# Patient Record
Sex: Female | Born: 1970
Health system: Southern US, Community
[De-identification: ages and names within clinical notes are randomized; demographics above are authoritative.]

## PROBLEM LIST (undated history)

## (undated) DIAGNOSIS — K219 Gastro-esophageal reflux disease without esophagitis: Secondary | ICD-10-CM

## (undated) DIAGNOSIS — D649 Anemia, unspecified: Secondary | ICD-10-CM

## (undated) DIAGNOSIS — T8859XA Other complications of anesthesia, initial encounter: Secondary | ICD-10-CM

## (undated) DIAGNOSIS — F329 Major depressive disorder, single episode, unspecified: Secondary | ICD-10-CM

## (undated) DIAGNOSIS — R112 Nausea with vomiting, unspecified: Secondary | ICD-10-CM

## (undated) DIAGNOSIS — M1990 Unspecified osteoarthritis, unspecified site: Secondary | ICD-10-CM

## (undated) DIAGNOSIS — Z9889 Other specified postprocedural states: Secondary | ICD-10-CM

## (undated) DIAGNOSIS — Z8489 Family history of other specified conditions: Secondary | ICD-10-CM

## (undated) DIAGNOSIS — Z87442 Personal history of urinary calculi: Secondary | ICD-10-CM

## (undated) DIAGNOSIS — I10 Essential (primary) hypertension: Secondary | ICD-10-CM

## (undated) DIAGNOSIS — O009 Unspecified ectopic pregnancy without intrauterine pregnancy: Secondary | ICD-10-CM

## (undated) DIAGNOSIS — B019 Varicella without complication: Secondary | ICD-10-CM

## (undated) HISTORY — DX: Unspecified ectopic pregnancy without intrauterine pregnancy: O00.90

## (undated) HISTORY — DX: Essential (primary) hypertension: I10

## (undated) HISTORY — DX: Varicella without complication: B01.9

## (undated) HISTORY — DX: Anemia, unspecified: D64.9

## (undated) HISTORY — DX: Major depressive disorder, single episode, unspecified: F32.9

## (undated) HISTORY — PX: TYMPANOSTOMY TUBE PLACEMENT: SHX32

## (undated) HISTORY — DX: Gastro-esophageal reflux disease without esophagitis: K21.9

## (undated) HISTORY — PX: FRACTURE SURGERY: SHX138

---
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
## 1993-01-13 HISTORY — PX: TUBAL LIGATION: SHX77

## 2002-01-13 HISTORY — PX: LITHOTRIPSY: SUR834

## 2003-03-05 ENCOUNTER — Other Ambulatory Visit: Payer: Self-pay

## 2004-01-14 HISTORY — PX: OTHER SURGICAL HISTORY: SHX169

## 2004-08-07 ENCOUNTER — Ambulatory Visit: Payer: Self-pay

## 2004-12-30 ENCOUNTER — Observation Stay: Payer: Self-pay | Admitting: Obstetrics & Gynecology

## 2005-01-28 ENCOUNTER — Observation Stay: Payer: Self-pay

## 2005-03-01 ENCOUNTER — Observation Stay: Payer: Self-pay

## 2005-04-07 ENCOUNTER — Inpatient Hospital Stay: Payer: Self-pay | Admitting: Obstetrics & Gynecology

## 2005-04-07 ENCOUNTER — Observation Stay: Payer: Self-pay | Admitting: Obstetrics & Gynecology

## 2005-04-09 DIAGNOSIS — O339 Maternal care for disproportion, unspecified: Secondary | ICD-10-CM

## 2005-04-09 DIAGNOSIS — O41 Oligohydramnios: Secondary | ICD-10-CM

## 2005-04-14 ENCOUNTER — Emergency Department: Payer: Self-pay | Admitting: Emergency Medicine

## 2005-04-14 ENCOUNTER — Other Ambulatory Visit: Payer: Self-pay

## 2005-05-13 ENCOUNTER — Encounter: Payer: Self-pay | Admitting: Family Medicine

## 2005-05-13 LAB — CONVERTED CEMR LAB: Pap Smear: NORMAL

## 2006-01-13 DIAGNOSIS — O009 Unspecified ectopic pregnancy without intrauterine pregnancy: Secondary | ICD-10-CM

## 2006-01-13 HISTORY — DX: Unspecified ectopic pregnancy without intrauterine pregnancy: O00.90

## 2006-02-04 ENCOUNTER — Ambulatory Visit: Payer: Self-pay | Admitting: Family Medicine

## 2006-06-11 ENCOUNTER — Ambulatory Visit: Payer: Self-pay

## 2006-06-14 ENCOUNTER — Ambulatory Visit: Payer: Self-pay

## 2006-06-15 ENCOUNTER — Other Ambulatory Visit: Payer: Self-pay

## 2006-06-15 ENCOUNTER — Ambulatory Visit: Payer: Self-pay

## 2006-06-18 ENCOUNTER — Ambulatory Visit: Payer: Self-pay

## 2006-07-22 ENCOUNTER — Emergency Department: Payer: Self-pay | Admitting: Emergency Medicine

## 2006-08-14 HISTORY — PX: LAPAROSCOPY FOR ECTOPIC PREGNANCY: SUR765

## 2006-08-27 ENCOUNTER — Ambulatory Visit: Payer: Self-pay | Admitting: Unknown Physician Specialty

## 2006-09-14 HISTORY — PX: OTHER SURGICAL HISTORY: SHX169

## 2006-11-11 ENCOUNTER — Ambulatory Visit: Payer: Self-pay | Admitting: Family Medicine

## 2006-11-11 LAB — CONVERTED CEMR LAB: Rapid Strep: NEGATIVE

## 2006-11-13 ENCOUNTER — Encounter: Payer: Self-pay | Admitting: Family Medicine

## 2006-11-13 DIAGNOSIS — F329 Major depressive disorder, single episode, unspecified: Secondary | ICD-10-CM | POA: Insufficient documentation

## 2006-11-13 DIAGNOSIS — D509 Iron deficiency anemia, unspecified: Secondary | ICD-10-CM | POA: Insufficient documentation

## 2006-11-13 DIAGNOSIS — K219 Gastro-esophageal reflux disease without esophagitis: Secondary | ICD-10-CM | POA: Insufficient documentation

## 2006-11-13 DIAGNOSIS — I10 Essential (primary) hypertension: Secondary | ICD-10-CM | POA: Insufficient documentation

## 2006-11-16 ENCOUNTER — Telehealth: Payer: Self-pay | Admitting: Internal Medicine

## 2006-11-18 ENCOUNTER — Telehealth: Payer: Self-pay | Admitting: Family Medicine

## 2007-03-16 ENCOUNTER — Ambulatory Visit: Payer: Self-pay | Admitting: Family Medicine

## 2007-03-16 DIAGNOSIS — J019 Acute sinusitis, unspecified: Secondary | ICD-10-CM | POA: Insufficient documentation

## 2007-03-23 ENCOUNTER — Telehealth (INDEPENDENT_AMBULATORY_CARE_PROVIDER_SITE_OTHER): Payer: Self-pay | Admitting: Internal Medicine

## 2007-03-26 ENCOUNTER — Telehealth: Payer: Self-pay | Admitting: Family Medicine

## 2007-04-04 DIAGNOSIS — B029 Zoster without complications: Secondary | ICD-10-CM | POA: Insufficient documentation

## 2007-04-06 ENCOUNTER — Ambulatory Visit: Payer: Self-pay | Admitting: Internal Medicine

## 2007-04-07 ENCOUNTER — Telehealth (INDEPENDENT_AMBULATORY_CARE_PROVIDER_SITE_OTHER): Payer: Self-pay | Admitting: *Deleted

## 2007-04-12 ENCOUNTER — Encounter (INDEPENDENT_AMBULATORY_CARE_PROVIDER_SITE_OTHER): Payer: Self-pay | Admitting: *Deleted

## 2007-04-22 ENCOUNTER — Ambulatory Visit: Payer: Self-pay | Admitting: Family Medicine

## 2007-04-22 DIAGNOSIS — E860 Dehydration: Secondary | ICD-10-CM | POA: Insufficient documentation

## 2007-04-22 DIAGNOSIS — A088 Other specified intestinal infections: Secondary | ICD-10-CM | POA: Insufficient documentation

## 2007-04-22 LAB — CONVERTED CEMR LAB
Nitrite: NEGATIVE
WBC Urine, dipstick: NEGATIVE
pH: 6.5

## 2007-04-26 ENCOUNTER — Encounter (INDEPENDENT_AMBULATORY_CARE_PROVIDER_SITE_OTHER): Payer: Self-pay | Admitting: *Deleted

## 2007-04-26 ENCOUNTER — Telehealth: Payer: Self-pay | Admitting: Family Medicine

## 2007-05-12 ENCOUNTER — Telehealth: Payer: Self-pay | Admitting: Family Medicine

## 2007-05-19 ENCOUNTER — Ambulatory Visit: Payer: Self-pay | Admitting: Family Medicine

## 2007-05-20 LAB — CONVERTED CEMR LAB
AST: 19 units/L (ref 0–37)
Calcium: 9.2 mg/dL (ref 8.4–10.5)
Chloride: 110 meq/L (ref 96–112)
Creatinine, Ser: 1 mg/dL (ref 0.4–1.2)
GFR calc non Af Amer: 66 mL/min
LDL Cholesterol: 104 mg/dL — ABNORMAL HIGH (ref 0–99)
Total Bilirubin: 1 mg/dL (ref 0.3–1.2)
Total CHOL/HDL Ratio: 3.6
Triglycerides: 92 mg/dL (ref 0–149)

## 2008-01-14 DIAGNOSIS — F32A Depression, unspecified: Secondary | ICD-10-CM

## 2008-01-14 HISTORY — DX: Depression, unspecified: F32.A

## 2008-03-01 ENCOUNTER — Observation Stay: Payer: Self-pay

## 2008-03-16 ENCOUNTER — Observation Stay: Payer: Self-pay | Admitting: Obstetrics & Gynecology

## 2008-03-20 ENCOUNTER — Ambulatory Visit: Payer: Self-pay | Admitting: Obstetrics & Gynecology

## 2008-03-21 ENCOUNTER — Inpatient Hospital Stay: Payer: Self-pay | Admitting: Obstetrics & Gynecology

## 2008-05-29 ENCOUNTER — Encounter: Payer: Self-pay | Admitting: Family Medicine

## 2008-06-05 ENCOUNTER — Ambulatory Visit: Payer: Self-pay | Admitting: General Surgery

## 2008-06-16 ENCOUNTER — Encounter: Payer: Self-pay | Admitting: Family Medicine

## 2008-06-16 ENCOUNTER — Ambulatory Visit: Payer: Self-pay | Admitting: General Surgery

## 2008-06-26 ENCOUNTER — Encounter: Payer: Self-pay | Admitting: Family Medicine

## 2008-11-01 ENCOUNTER — Ambulatory Visit: Payer: Self-pay | Admitting: Obstetrics & Gynecology

## 2008-11-02 ENCOUNTER — Ambulatory Visit: Payer: Self-pay | Admitting: Internal Medicine

## 2009-01-13 HISTORY — PX: UMBILICAL HERNIA REPAIR: SHX196

## 2009-08-22 ENCOUNTER — Ambulatory Visit: Payer: Self-pay | Admitting: Internal Medicine

## 2009-12-20 ENCOUNTER — Ambulatory Visit: Payer: Self-pay | Admitting: Obstetrics & Gynecology

## 2010-01-23 ENCOUNTER — Emergency Department: Payer: Self-pay | Admitting: Emergency Medicine

## 2010-05-31 NOTE — Assessment & Plan Note (Signed)
[facility]                           [place] OFFICE NOTE   AURIEL, KIST                    MRN:          [id number]  DATE:02/04/2006                            DOB:          [birth year]    CHIEF COMPLAINT:  A 40 year old white female here to establish new  doctor.   HISTORY OF PRESENT ILLNESS:  1. Left ear tinnitus, vertigo:  Ms. Dobler states that she has      been having a sensation that she has fluid in her left ear along      with tinnitus and vertigo since early November.  The vertigo      continues to happen intermittently.  She was seen at an Urgent      Care, and at this point in time, they stated that she may have a      perforated eardrum as well as BPPV.  She was given a prescription      for Meclizine.  She is still taking this medication 2 times a day,      but has not noticed any improvement in her symptoms.  She does have      a history of chronic ear infections and had 2 sets of ear tubes as      a child.  She reports occasional congestion, pressure in her left      ear, but no pain.  She denies sinus pain.  2. Tachycardia:  Ms. Mohiuddin reports that over the past month she      has had 4 episodes where she has tachycardia, shortness of breath,      nausea, and feels as though she may die.  She states that she is      able to talk herself down and relax by concentrating and taking      deep breaths.  She states that she has been under more stress and      has been very sad since a family member who was close to her passed      away in November.  She has been having some difficulty handling      this.  She feels fairly overwhelmed.  She is concerned that this      could be secondary to something other than stress and anxiety.  She      does have a history of depression, but has not had problems with      anxiety in the past.  She reports some mild anhedonia, insomnia,      but denies decreased energy, slowed  thinking.  She also denies      suicidal or homicidal ideation.   REVIEW OF SYSTEMS:  Otherwise, negative.   PAST MEDICAL HISTORY:  1. Depression.  2. Iron deficiency anemia.  3. Gastroesophageal reflux disease.  4. Hypertension during pregnancy.   HOSPITALIZATIONS, SURGERIES, PROCEDURES:  1. Two sets of ear tubes.  2. In 2004, lithotripsy for kidney stones.  3. In 1987, jaw surgery.  4. In 1979, tonsillectomy.  5. In 1995, BTL.  6. In 2006, BTL reversal.  7. In 2007, C-section.  8.  Pap smear in May of 2007, negative.   ALLERGIES:  PERCOCET causing hallucinations.   MEDICATIONS:  1. TriNessa birth control pills.  2. Multivitamin daily.  3. Meclizine 25 mg 1 tablet q.6h p.r.n. vertigo.   SOCIAL HISTORY:  No tobacco use.  No alcohol use.  No drug use.  She  works at an [employer].  She has been married for 2 years.  She  has 3 kids who are healthy.  She walks 20 minutes on the treadmill  daily.  She tries to eat veggies and a healthy diet, but does not like  fruit.   FAMILY HISTORY:  Her father is alive at age 55 with diabetes.  Mother  alive at age 2 and healthy.  Maternal grandmother with Alzheimer's.  Her paternal grandfather had a heart attack at age 1, and therefore she  has a strong family history of MI before age 57.  She has 1 brother and  1 sister who are healthy.  There is no family history of cancer, other  than lung cancer in a paternal uncle.   PHYSICAL EXAM:  VITAL SIGNS:  Height is 64 inches, weight 138.8, making  BMI 24.  Blood pressure 116/80, pulse 80, temperature 98.3.  GENERAL:  Healthy-appearing female in no acute distress.  HEENT:  PERRLA.  Extraocular muscles are intact.  No nystagmus.  Oropharynx clear.  Tympanic membrane on the right has chronic scarring  and healed tympanic membrane opening.  Tympanic membrane on the left has  even more severe scarring.  Difficult to see fluid behind the eardrum  secondary to opacity of eardrum.  There  is also an indentation that  could be a tympanic membrane perforation versus old healed or partially  healed ear tube opening.  No erythema.  No drainage.  Negative Dix-  Hallpike maneuver.  No thyromegaly.  No lymphadenopathy supraclavicular or cervical.  CARDIOVASCULAR:  Regular rate and rhythm.  No murmurs, rubs, or gallops.  No peripheral edema.  2+ peripheral pulses.  LUNGS:  Clear to auscultation bilaterally.  No wheeze, rales, or  rhonchi.  ABDOMEN:  Soft and nontender.  Normoactive bowel sounds.  No  hepatosplenomegaly.  MUSCULOSKELETAL:  Strength 5/5 in upper and lower extremities.  NEUROLOGIC:  Cranial nerves 2-12 are intact.  Alert and oriented x3.   PROCEDURES:  Hearing screen partially performed secondary to instrument  malfunction.  Left ear decreased at 500, normal at 1000, 2000, and 4000.  Tested at 25 dB.  EKG, occasional PVCs, no ST changes, no Q waves.  No  other abnormality.  No old EKG to compare with.   ASSESSMENT AND PLAN:  1. Left ear abnormality with tinnitus, vertigo:  At this point in      time, her symptoms do not seem to be simple benign paroxysmal      positional vertigo given that they have been ongoing for so long.      She also has tinnitus and abnormal-appearing eardrum, and is      difficult because of chronic scarring to determine if there is      fluid or infection behind the left eardrum.  Given her symptoms, we      will start with a simple course of amoxicillin 500 mg 2 tablets      twice daily for 10 days.  We will then have her return following      this course for repeat hearing tests.  If she continues to have  vertigo, tinnitus, and hearing loss, we will refer her to ear,      nose, and throat doctor.  She was also given a temporary refill of      Meclizine to use p.r.n. vertigo that is severe.  2. Episodes of tachycardia:  I think the finding of premature      ventricular contractions are incidental.  Her symptoms that she      describes sound very much like panic attacks.  When the EKG was      done, she was denying any symptoms.  When she returns for her next      visit, we can consider medication to treat anxiety as well as      possible mild depression.  Otherwise, her physical exam today does      not lead to any other specific causes of her symptoms.  3. Prevention:  She is given a tetanus vaccine today.  She is up to      date with her Pap smears.  We will have her return for a      cholesterol panel and complete metabolic panel for screening.  She      was encouraged to continue exercise and healthy eating habits.     Kerby Nora, MD  Electronically Signed    AB/MedQ  DD: 02/04/2006  DT: 02/04/2006  Job #: [id number]

## 2010-07-08 ENCOUNTER — Encounter: Payer: Self-pay | Admitting: Family Medicine

## 2010-07-09 ENCOUNTER — Encounter: Payer: Self-pay | Admitting: Family Medicine

## 2010-07-09 ENCOUNTER — Ambulatory Visit (INDEPENDENT_AMBULATORY_CARE_PROVIDER_SITE_OTHER): Payer: BC Managed Care – PPO | Admitting: Family Medicine

## 2010-07-09 VITALS — BP 120/72 | HR 76 | Temp 99.0°F | Ht 64.0 in | Wt 132.8 lb

## 2010-07-09 DIAGNOSIS — M79609 Pain in unspecified limb: Secondary | ICD-10-CM

## 2010-07-09 DIAGNOSIS — M79606 Pain in leg, unspecified: Secondary | ICD-10-CM

## 2010-07-09 DIAGNOSIS — M25512 Pain in left shoulder: Secondary | ICD-10-CM

## 2010-07-09 DIAGNOSIS — M25519 Pain in unspecified shoulder: Secondary | ICD-10-CM

## 2010-07-09 DIAGNOSIS — T148XXA Other injury of unspecified body region, initial encounter: Secondary | ICD-10-CM

## 2010-07-09 MED ORDER — NAPROXEN 500 MG PO TABS: ORAL_TABLET | ORAL | Status: DC

## 2010-07-09 NOTE — Progress Notes (Signed)
  Subjective:    Patient ID: Laura Blackburn, female    DOB: 1970/08/26, 40 y.o.   MRN: [id number]  HPI CC: leg pain, shoulder pain  1. 1 wk h/o Leg pain primarily R leg shooting pain, sometimes L.  Also with bruising for last several weeks, unexplained.  Denies bumping into anything, trauma etc.  Not positional, not better after soaking in warm tub, not better with tylenol, ibuprofen.  Sleep helps.  No other bleeding anywhere.  No blood in stool or urine.  No fevers/chills.  + hot flashes x 6 mo.  No weight changes.  2. L shoulder pain - 3 wks ago slipped and fell in water, caught herself with left arm.  Now having burning sensation starting posterior L shoulder coming down upper arm.  Also endorsing tingling sensation in hands.  + left arm feels weak at times.  Has not dropped any objects.  Worse with lifting arm above head.  No neck pain/stiffness.  H/o easily bruising after third child (emergency C/S), told due to low potassium and iron. Both fallopian tubes removed.  Not currently on birth control  Review of Systems Per HPI    Objective:   Physical Exam  Nursing note and vitals reviewed. Constitutional: She appears well-developed and well-nourished. No distress.  HENT:  Head: Normocephalic and atraumatic.  Mouth/Throat: Oropharynx is clear and moist. No oropharyngeal exudate.  Eyes: Conjunctivae and EOM are normal. Pupils are equal, round, and reactive to light. No scleral icterus.  Neck: Normal range of motion. Neck supple.  Musculoskeletal:       Right shoulder: Normal.       Left shoulder: She exhibits decreased range of motion, tenderness and decreased strength. She exhibits no bony tenderness, no swelling, no effusion and no deformity.       Cervical back: Normal. She exhibits no tenderness.       decr ROM L shoulder with abduction/anterior flexion Tender to palpation left posterior inferior to shoulder extending to upper arm down to elbow. + empty can sign, main pain  here. Weakness 2/2 pain with testing RTC ext>int rotation at shoulder. Some tenderness with rotation of humeral head GH joint.  Neurological: She has normal strength and normal reflexes. No sensory deficit.  Reflex Scores:      Tricep reflexes are 2+ on the right side and 2+ on the left side.      Bicep reflexes are 2+ on the right side and 2+ on the left side.      Brachioradialis reflexes are 2+ on the right side and 2+ on the left side.      Grip strength intact bilaterally  Skin: Skin is warm and dry.          Ecchymosis x 1 right posterior calf, rest of spots seem to be pronounced veins on legs, some tender.          Assessment & Plan:

## 2010-07-09 NOTE — Assessment & Plan Note (Signed)
Anticipate RTC tendinopathy, not consistent with full tear.  Treat conservatively with NSAID,s ice, and exercises from SM pt advisor.  If not better, consider PT vs referral to ortho for evaluation.  If not better after 4-6 wks of therapy, consider MRI. Doubt labral etiology although did have some tenderness with rotation of humeral head in socket. Doubt bursitis. rtc 2-3 wks for f/u.

## 2010-07-09 NOTE — Patient Instructions (Signed)
I'm not sure what's causing leg pain.  Continue to monitor for now, checked CBC for bruising. As far as shoulder, could be rotator cuff tendinitis. Treat with naprosyn twice daily for 1 wk then as needed (take with food) Continue ice to shoulder. Stretching exercises provided today. Update [place] if any worsening despite these measures.

## 2010-07-09 NOTE — Assessment & Plan Note (Signed)
Shooting pain, not cramping, with bruising and prominent vessels. Unsure where pain is coming from.  Doubt spine etiology. No other bleeding diathesis sxs. Check CBC, h/o IDA. If not improved consider checking K, Mg.

## 2010-07-10 LAB — CBC WITH DIFFERENTIAL/PLATELET
Basophils Absolute: 0 10*3/uL (ref 0.0–0.1)
Basophils Relative: 0.5 % (ref 0.0–3.0)
Eosinophils Relative: 5.4 % — ABNORMAL HIGH (ref 0.0–5.0)
HCT: 36.9 % (ref 36.0–46.0)
Hemoglobin: 12.6 g/dL (ref 12.0–15.0)
Lymphocytes Relative: 37 % (ref 12.0–46.0)
Lymphs Abs: 2 10*3/uL (ref 0.7–4.0)
Monocytes Relative: 5.7 % (ref 3.0–12.0)
Neutro Abs: 2.8 10*3/uL (ref 1.4–7.7)
RBC: 3.85 Mil/uL — ABNORMAL LOW (ref 3.87–5.11)
RDW: 12.3 % (ref 11.5–14.6)

## 2010-07-30 ENCOUNTER — Ambulatory Visit: Payer: BC Managed Care – PPO | Admitting: Family Medicine

## 2010-09-30 ENCOUNTER — Ambulatory Visit (INDEPENDENT_AMBULATORY_CARE_PROVIDER_SITE_OTHER)
Admission: RE | Admit: 2010-09-30 | Discharge: 2010-09-30 | Disposition: A | Discharge status: Home / Self Care | Payer: BC Managed Care – PPO | Source: Ambulatory Visit | Attending: Family Medicine | Admitting: Family Medicine

## 2010-09-30 ENCOUNTER — Encounter: Payer: Self-pay | Admitting: Family Medicine

## 2010-09-30 ENCOUNTER — Ambulatory Visit (INDEPENDENT_AMBULATORY_CARE_PROVIDER_SITE_OTHER): Payer: BC Managed Care – PPO | Admitting: Family Medicine

## 2010-09-30 DIAGNOSIS — M25519 Pain in unspecified shoulder: Secondary | ICD-10-CM

## 2010-09-30 DIAGNOSIS — M25512 Pain in left shoulder: Secondary | ICD-10-CM

## 2010-09-30 DIAGNOSIS — M25529 Pain in unspecified elbow: Secondary | ICD-10-CM

## 2010-09-30 DIAGNOSIS — J329 Chronic sinusitis, unspecified: Secondary | ICD-10-CM

## 2010-09-30 DIAGNOSIS — M25522 Pain in left elbow: Secondary | ICD-10-CM

## 2010-09-30 MED ORDER — AMOXICILLIN-POT CLAVULANATE 875-125 MG PO TABS: 1.0000 | ORAL_TABLET | Freq: Two times a day (BID) | ORAL | Status: AC

## 2010-09-30 MED ORDER — FLUTICASONE PROPIONATE 50 MCG/ACT NA SUSP: 2.0000 | Freq: Every day | NASAL | Status: DC

## 2010-09-30 MED ORDER — CYCLOBENZAPRINE HCL 5 MG PO TABS: 5.0000 mg | ORAL_TABLET | Freq: Three times a day (TID) | ORAL | Status: AC | PRN

## 2010-09-30 MED ORDER — NAPROXEN 500 MG PO TABS: ORAL_TABLET | ORAL | Status: DC

## 2010-09-30 NOTE — Assessment & Plan Note (Signed)
Xray today negative for fracture. ? Elbow sprain.  Treat with NSAID and ice/heat. Update if not improving as expected.

## 2010-09-30 NOTE — Progress Notes (Signed)
  Subjective:    Patient ID: Laura Blackburn, female    DOB: 1970/12/24, 40 y.o.   MRN: [id number]  HPI CC: left side pain  This morning, taking daughter to school, and holding 2yo daughter fell on pavement, hit left arm and left hip.  Tripped in heels on acorns.  Witness said brunt of fall was on arm.  Feeling better if restricting movement.  Arm feels better when held against body.  Pain worse from elbow up to shoulder.  No tingling or numbness.  Has had falls in past, 2 mo ago slipped in water at work and fell.  Evaluated, thought RTC tendinitis, improved with strengthening exercises.  Also having more sinus congestion for last 11 days.  Tried claritin D and mucinex which has helped only some.  Still having sinus pressure headaches.  No nasal saline, never tried nasal steriods.  Thinks may have had fever at night but not documented.  No ST.  + PNDrip.  Review of Systems Per HPI    Objective:   Physical Exam  Nursing note and vitals reviewed. Constitutional: She appears well-developed and well-nourished. No distress.  HENT:  Head: Normocephalic and atraumatic.  Right Ear: Hearing, external ear and ear canal normal.  Left Ear: Hearing, external ear and ear canal normal.  Nose: Mucosal edema present. No rhinorrhea. Right sinus exhibits maxillary sinus tenderness and frontal sinus tenderness. Left sinus exhibits maxillary sinus tenderness and frontal sinus tenderness.  Mouth/Throat: Uvula is midline, oropharynx is clear and moist and mucous membranes are normal. No oropharyngeal exudate, posterior oropharyngeal edema, posterior oropharyngeal erythema or tonsillar abscesses.       TMs scarred bilaterally  Eyes: Conjunctivae and EOM are normal. Pupils are equal, round, and reactive to light. No scleral icterus.  Neck: Normal range of motion. Neck supple. No JVD present. No thyromegaly present.  Cardiovascular: Normal rate, regular rhythm, normal heart sounds and intact distal pulses.   No  murmur heard. Pulmonary/Chest: Effort normal and breath sounds normal. No respiratory distress. She has no wheezes. She has no rales.  Musculoskeletal: She exhibits no edema.       Decreased ROM L shoulder 2/2 pain in forward and lateral flexion. Tender to palpation anterior/posterior shoulder and deltoid down lateral arm.  Mild tenderness diffusely at elbow but FROM flexion/extension intact.  No deformity.  Lymphadenopathy:    She has no cervical adenopathy.  Skin: Skin is warm and dry. No rash noted.          Assessment & Plan:

## 2010-09-30 NOTE — Patient Instructions (Addendum)
For sinus congestion - start with nasal saline, if not helping, may fill nasal steroid (i've sent to pharmacy).  Start antibiotic augmentin twice daily for 10 days.  If any worsening (fever >101.5, worse pain and congestion), let me know. For arm pain - xrays today.  Shoulder and elbow looking ok - no fracture or dislocation on my read.  If any change based on radiology read we will call you with change in plan.  In meantime, may use anti inflammatory and muscle relaxant.  Ice to joints for next 2 days then may alternate ice and heat.  Update [place] if not improving as expected or any worsening.

## 2010-09-30 NOTE — Assessment & Plan Note (Addendum)
Checked xray to r/o dislocation or fracture.  Ok on my read.  liekly shoulder strain/possible bursitis after fall. Ice/heat for shoulder, NSAIDs, flexeril. Update if not improving as expected.

## 2010-09-30 NOTE — Assessment & Plan Note (Signed)
Going on 10+ days, treat as bacterial sinusitis with augmentin course. Treat with INS as well. Update if not improving as expected.

## 2010-10-02 ENCOUNTER — Telehealth: Payer: Self-pay | Admitting: *Deleted

## 2010-10-02 DIAGNOSIS — M25512 Pain in left shoulder: Secondary | ICD-10-CM

## 2010-10-02 DIAGNOSIS — M25522 Pain in left elbow: Secondary | ICD-10-CM

## 2010-10-02 NOTE — Telephone Encounter (Signed)
Pt was seen on Monday for arm injury.  She is not any better.  Says her upper arm is still in a lot of pain, feels a burning sensation.  She is asking if there could be some nerve damage.  She's taking naproxen but she cant tell that this is helping.  She's also asking if she should just give this more time.  [facility]ses [facility] in [place].

## 2010-10-03 NOTE — Telephone Encounter (Signed)
There could be some nerve damage.  I'll want her to come back to be evaluated with PCP, Copland or myself if pain not improving.  Or we could set her up with ortho for arm evaluation.

## 2010-10-03 NOTE — Telephone Encounter (Signed)
Placed order in chart.

## 2010-10-03 NOTE — Telephone Encounter (Signed)
Spoke with patient. She prefers to go ahead to ortho. She requests someone in [place]. She will await call from [facility].

## 2010-10-04 MED ORDER — TRAMADOL HCL 50 MG PO TABS: 50.0000 mg | ORAL_TABLET | Freq: Three times a day (TID) | ORAL | Status: AC | PRN

## 2010-10-04 NOTE — Telephone Encounter (Signed)
Called patient to remind her to come by and pick up her Xray disc for Dr Garen Lah appt this am. She said she had to reschedule to next Thurs 9/27 because of her job. She is asking again for something stronger to take for the pain? Wondering if she can take two naproxen instead of one? One not really touching the pain. Please advise and call her back at [phone].

## 2010-10-04 NOTE — Telephone Encounter (Signed)
Can do tramadol 50mg  as needed.  To not drive after taking.  It's a narcotic but not as strong as oxycodone (previous reaction)

## 2010-10-04 NOTE — Telephone Encounter (Signed)
Patient notified and will try the tramadol.

## 2010-12-09 ENCOUNTER — Ambulatory Visit (INDEPENDENT_AMBULATORY_CARE_PROVIDER_SITE_OTHER): Payer: BC Managed Care – PPO | Admitting: Family Medicine

## 2010-12-09 ENCOUNTER — Telehealth: Payer: Self-pay | Admitting: *Deleted

## 2010-12-09 ENCOUNTER — Encounter: Payer: Self-pay | Admitting: Family Medicine

## 2010-12-09 VITALS — BP 118/78 | HR 88 | Temp 98.5°F | Wt 130.2 lb

## 2010-12-09 DIAGNOSIS — J111 Influenza due to unidentified influenza virus with other respiratory manifestations: Secondary | ICD-10-CM

## 2010-12-09 DIAGNOSIS — R6889 Other general symptoms and signs: Secondary | ICD-10-CM

## 2010-12-09 DIAGNOSIS — J029 Acute pharyngitis, unspecified: Secondary | ICD-10-CM

## 2010-12-09 MED ORDER — PROMETHAZINE HCL 25 MG PO TABS: 25.0000 mg | ORAL_TABLET | Freq: Four times a day (QID) | ORAL | Status: DC | PRN

## 2010-12-09 MED ORDER — OSELTAMIVIR PHOSPHATE 75 MG PO CAPS: 75.0000 mg | ORAL_CAPSULE | Freq: Two times a day (BID) | ORAL | Status: AC

## 2010-12-09 MED ORDER — PROMETHAZINE HCL 25 MG PO TABS: 25.0000 mg | ORAL_TABLET | Freq: Four times a day (QID) | ORAL | Status: AC | PRN

## 2010-12-09 NOTE — Assessment & Plan Note (Signed)
Today started with influenza like illness. Treat with tamiflu - discussed side effects to watch for. alternatively could be component of sinusitis as well - will have pt call me at end of week if sxs not improving for possible augmentin course. Red flags to return discussed. Lungs clear today.

## 2010-12-09 NOTE — Telephone Encounter (Signed)
May call in phenergan.  Placed in chart.

## 2010-12-09 NOTE — Progress Notes (Signed)
  Subjective:    Patient ID: Laura Blackburn, female    DOB: 1970/10/16, 40 y.o.   MRN: [id number]  HPI CC: flu?  ST  At 2am this morning started with chills with sweat, body aches, subjective fever, ST.  + cough productive of green sputum.  Back and leg aches.  Also having 8d h/o sinus sxs - congestion, right sided head pressure, maxillary sinus soreness, ST, PNdrainage.  No abd pain, rashes, ear pain, tooth pain, joint pain, chest pain, SOB.  So far has tried tylenol cold/sinus.  Sick contacts at work, not at home.  No smokers at home.  Asthma as child, none recently.  No flu shot this year.  Has had flu once prior, felt similar.  Review of Systems Per HPI    Objective:   Physical Exam  Nursing note and vitals reviewed. Constitutional: She appears well-developed and well-nourished. No distress.  HENT:  Head: Normocephalic and atraumatic.  Right Ear: Hearing, external ear and ear canal normal.  Left Ear: Hearing, external ear and ear canal normal.  Nose: No mucosal edema or rhinorrhea. Right sinus exhibits maxillary sinus tenderness. Right sinus exhibits no frontal sinus tenderness. Left sinus exhibits maxillary sinus tenderness. Left sinus exhibits no frontal sinus tenderness.  Mouth/Throat: Uvula is midline, oropharynx is clear and moist and mucous membranes are normal. No oropharyngeal exudate, posterior oropharyngeal edema, posterior oropharyngeal erythema or tonsillar abscesses.       Scarred TMs bilaterally R>L maxillary sinus tenderness  Eyes: Conjunctivae and EOM are normal. Pupils are equal, round, and reactive to light. No scleral icterus.  Neck: Normal range of motion. Neck supple. No JVD present. No thyromegaly present.  Cardiovascular: Normal rate, regular rhythm, normal heart sounds and intact distal pulses.   No murmur heard. Pulmonary/Chest: Effort normal and breath sounds normal. No respiratory distress. She has no wheezes. She has no rales.  Lymphadenopathy:      She has no cervical adenopathy.  Skin: Skin is warm and dry. No rash noted.       Assessment & Plan:

## 2010-12-09 NOTE — Telephone Encounter (Signed)
Patient called stating that she was seen earlier today. Patient states that she now has diarrhea and nausea and wants to know if you can call something in for this? Patient thinks that she was given phenergan for nausea previously. Please advise.

## 2010-12-09 NOTE — Patient Instructions (Signed)
I think you have the flu or a flu like illness. Take tamiflu twice daily for 5 days. If sinus symptoms not better by end of week, let me know. Push fluids and plenty of rest.  Influenza, Adult Influenza ("the flu") is a viral infection of the respiratory tract. It causes chills, fever, cough, headache, body aches, and sore throat. Influenza in general will make you feel sicker than when you have a common cold. Symptoms of the illness typically last a few days. Cough and fatigue may continue for as long as 7 to 10 days. Influenza is highly contagious. It spreads easily to others in the droplets from coughs and sneezes. People frequently become infected by touching something that was recently contaminated with the virus and then touch their mouth, nose or eyes. This infection is caused by a virus. Symptoms will not be reduced or improved by taking an antibiotic. Antibiotics are medications that kill bacteria, not viruses. DIAGNOSIS  Diagnosis of influenza is often made based on the history and physical examination as well as the presence of influenza reports occurring in your community. Testing can be done if the diagnosis is not certain. TREATMENT  Since influenza is caused by a virus, antibiotics are not helpful. Your caregiver may prescribe antiviral medicines to shorten the illness and lessen the severity. Your caregiver may also recommend influenza vaccination and/or antiviral medicines for your family members in order to prevent the spread of influenza to them. HOME CARE INSTRUCTIONS  DO NOT GIVE ASPIRIN TO PERSONS WITH INFLUENZA WHO ARE UNDER 26 YEARS OF AGE. This could lead to brain and liver damage (Reye's syndrome). Read the label on over-the-counter medicines.   Stay home from work or school if at all possible until most of your symptoms are gone.   Only take over-the-counter or prescription medicines for pain, discomfort, or fever as directed by your caregiver.   Use a cool mist  humidifier to increase air moisture. This will make breathing easier.   Rest until your temperature is nearly normal: 98.6 F (37 C). This usually takes 3 to 4 days. Be sure you get plenty of rest.   Drink at least eight, eight-ounce glasses of fluids per day. Fluids include water, juice, broth, gelatin, or lemonade.   Cover your mouth and nose when coughing or sneezing and wash your hands often to prevent the spread of this virus to other persons.  PREVENTION  Annual influenza vaccination (flu shots) is the best way to avoid getting influenza. An annual flu shot is now routinely recommended for all adults in the [place] SEEK MEDICAL CARE IF:   You develop shortness of breath while resting.   You have a deep cough with production of mucous or chest pain.   You develop nausea (feeling sick to your stomach), vomiting, or diarrhea.  SEEK IMMEDIATE MEDICAL CARE IF:   You have difficulty breathing, become short of breath, or your skin or nails turn bluish.   You develop severe neck pain or stiffness.   You develop a severe headache, facial pain, or earache.   You have a fever.   You develop nausea or vomiting that cannot be controlled.  Document Released: 12/28/1999 Document Revised: 09/11/2010 Document Reviewed: 11/01/2008 [facility] Patient Information 2012 [place], [place].

## 2010-12-09 NOTE — Telephone Encounter (Signed)
Rx called in as directed and patient notified.  

## 2010-12-19 ENCOUNTER — Emergency Department: Payer: Self-pay | Admitting: Emergency Medicine

## 2010-12-19 ENCOUNTER — Telehealth: Payer: Self-pay | Admitting: Internal Medicine

## 2010-12-19 MED ORDER — AMOXICILLIN-POT CLAVULANATE 875-125 MG PO TABS: 1.0000 | ORAL_TABLET | Freq: Two times a day (BID) | ORAL | Status: AC

## 2010-12-19 NOTE — Telephone Encounter (Signed)
Patient called and stated that she was Dx. With the flu and she is feeling better but she is still congested and would like to if she needs another round of antibiotics.  Please advise.

## 2010-12-19 NOTE — Telephone Encounter (Signed)
Will forward to Dr. Reece Agar who saw her.

## 2010-12-19 NOTE — Telephone Encounter (Signed)
What sxs is she having?  If continued significant sinus congestion with drainage, sinus pressure headache, may send in augmentin (placed in chart). If mild sinus congestion, no HA, no drainage, would alternatively have her start nasal saline irrigation 3 times daily and see if will improve with time. Continue flonase.

## 2010-12-19 NOTE — Telephone Encounter (Signed)
Spoke with patient. She said she has a lot of drainage and lots of sinus pressure, under her eyes is puffy and she has a ST from the drainage. Abx sent in and advised to continue flonase. Instructed to eat yogurt regularly to help avoid a yeast infection. Instructed to call if no improvement or if worsening. Patient verbalized understanding.

## 2010-12-23 ENCOUNTER — Telehealth: Payer: Self-pay | Admitting: Internal Medicine

## 2010-12-23 NOTE — Telephone Encounter (Signed)
She needs to be examined given recurrent fever, hand pain, neck pain... This is not clearly flu. Make her appt to be seen tommorow.

## 2010-12-23 NOTE — Telephone Encounter (Signed)
Patient was giving a z pack that she finished and her sinus issue is better. She says that is having fever chills and neck back and hand pain. Patient wants to know if maybe she has gotten the flu again\?

## 2010-12-23 NOTE — Telephone Encounter (Signed)
Patient called and stated she was seen 2 weeks ago and was given Tamiflu but on Thursday night the 6th she started having fever,headache, chills and body aches and she went to the ER and they stated she had a sinus infection and was dehydrated and gave her a shot of steroid.  Now she is still very weak and has developed diarrhea and nausea.  She doesn't know where to go from here.  Please advise.

## 2010-12-23 NOTE — Telephone Encounter (Signed)
Has she completed the antibiotic? Diarrhea and nausea may be from the antibiotic. ? Are her sinus infection symtoms improving?

## 2010-12-23 NOTE — Telephone Encounter (Signed)
Patient advised and appt made with dr g

## 2010-12-24 ENCOUNTER — Ambulatory Visit: Payer: BC Managed Care – PPO | Admitting: Family Medicine

## 2010-12-25 ENCOUNTER — Telehealth: Payer: Self-pay | Admitting: Internal Medicine

## 2010-12-25 NOTE — Telephone Encounter (Signed)
Triage Record Num: [id number] Operator: Remonia Richter Patient Name: Laura Blackburn Call Date & Time: 12/19/2010 10:54:37PM Patient Phone: [phone] PCP: Eustaquio Boyden Patient Gender: Female PCP Fax : [phone] Patient DOB: 1970-11-10 Practice Name: [facility] Reason for Call: Caller: [name]/Patient; PCP: Eustaquio Boyden; CB#: [phone]; Call Regarding Fever with neck pain and heart feels as if coming out of chest, seen 12/09/10 for flu and only took 3 days of Tamiflu,now vomiting with headache and pain in neck with movement, took Tylenol at 21:00 and did not help now took Advil and vomited,ER disposition obtained, caller moaning and talking in short sentences, will go to [facility] as it is 5 minutes from her home. Protocol(s) Used: Flu-Like Symptoms Recommended Outcome per Protocol: See ED Immediately Reason for Outcome: New onset of stiff neck causing pain with forward head movement, severe generalized headache, fever, or altered mental status Care Advice: ~ Another adult should drive. ~ IMMEDIATE ACTION Write down provider's name. List or place the following in a bag for transport with the patient: current prescription and/or nonprescription medications; alternative treatments, therapies and medications; and street drugs. ~ Call EMS 911 if any loss of consciousness or development of a widespread pinpoint red/purple rash (petechiae) or unexplained bruised-like areas (purpura) ~ Influenza Respiratory Hygiene: - Cover the nose/mouth tightly with a tissue when coughing or sneezing. - Use tissue one time and discard in the nearest waste receptacle. - Wash hands with soap and water or alcohol-based hand rub for at least 15 seconds after coming into contact with respiratory secretions and contaminated objects/materials. - When a tissue is not available, cough into the bend of the elbow. - Avoid touching your eyes, nose or mouth. - When ill wear a disposable  face mask when around others, especially around those at high risk for flu-related complications, to help decrease the likelihood of infecting them.

## 2010-12-25 NOTE — Telephone Encounter (Signed)
Called to check on patient she says she is feeling a lot better then before. She says she thinks its just going to take a while to feel 100% better

## 2011-02-06 ENCOUNTER — Ambulatory Visit: Payer: Self-pay | Admitting: Family Medicine

## 2012-03-05 ENCOUNTER — Ambulatory Visit: Payer: BC Managed Care – PPO | Admitting: Family Medicine

## 2012-03-08 ENCOUNTER — Ambulatory Visit (INDEPENDENT_AMBULATORY_CARE_PROVIDER_SITE_OTHER): Payer: BC Managed Care – PPO | Admitting: Family Medicine

## 2012-03-08 ENCOUNTER — Encounter: Payer: Self-pay | Admitting: Family Medicine

## 2012-03-08 VITALS — Ht 64.0 in

## 2012-03-08 DIAGNOSIS — R51 Headache: Secondary | ICD-10-CM

## 2012-03-10 DIAGNOSIS — R519 Headache, unspecified: Secondary | ICD-10-CM | POA: Insufficient documentation

## 2012-03-10 NOTE — Progress Notes (Signed)
  Subjective:    Patient ID: Laura Blackburn, female    DOB: June 25, 1970, 42 y.o.   MRN: [id number]  HPI Pt no showed.   Review of Systems     Objective:   Physical Exam        Assessment & Plan:

## 2012-09-27 ENCOUNTER — Ambulatory Visit: Payer: Self-pay | Admitting: Family Medicine

## 2014-05-15 ENCOUNTER — Other Ambulatory Visit: Payer: Self-pay | Admitting: Obstetrics and Gynecology

## 2014-05-15 DIAGNOSIS — R928 Other abnormal and inconclusive findings on diagnostic imaging of breast: Secondary | ICD-10-CM

## 2014-05-19 ENCOUNTER — Ambulatory Visit
Admission: RE | Admit: 2014-05-19 | Discharge: 2014-05-19 | Disposition: A | Discharge status: Home / Self Care | Payer: BLUE CROSS/BLUE SHIELD | Source: Ambulatory Visit | Attending: Obstetrics and Gynecology | Admitting: Obstetrics and Gynecology

## 2014-05-19 DIAGNOSIS — R928 Other abnormal and inconclusive findings on diagnostic imaging of breast: Secondary | ICD-10-CM

## 2014-05-19 DIAGNOSIS — N63 Unspecified lump in breast: Secondary | ICD-10-CM | POA: Diagnosis not present

## 2014-10-28 ENCOUNTER — Encounter: Payer: Self-pay | Admitting: Emergency Medicine

## 2014-10-28 ENCOUNTER — Emergency Department
Admission: EM | Admit: 2014-10-28 | Discharge: 2014-10-28 | Disposition: A | Discharge status: Home / Self Care | Payer: BLUE CROSS/BLUE SHIELD | Attending: Emergency Medicine | Admitting: Emergency Medicine

## 2014-10-28 DIAGNOSIS — I10 Essential (primary) hypertension: Secondary | ICD-10-CM | POA: Diagnosis not present

## 2014-10-28 DIAGNOSIS — R112 Nausea with vomiting, unspecified: Secondary | ICD-10-CM | POA: Diagnosis not present

## 2014-10-28 DIAGNOSIS — R197 Diarrhea, unspecified: Secondary | ICD-10-CM | POA: Diagnosis present

## 2014-10-28 DIAGNOSIS — Z79899 Other long term (current) drug therapy: Secondary | ICD-10-CM | POA: Diagnosis not present

## 2014-10-28 LAB — LIPASE, BLOOD: LIPASE: 29 U/L (ref 22–51)

## 2014-10-28 LAB — CBC WITH DIFFERENTIAL/PLATELET
BASOS PCT: 1 %
Basophils Absolute: 0.1 10*3/uL (ref 0–0.1)
Eosinophils Absolute: 0 10*3/uL (ref 0–0.7)
Eosinophils Relative: 0 %
HEMATOCRIT: 41.4 % (ref 35.0–47.0)
HEMOGLOBIN: 14 g/dL (ref 12.0–16.0)
Lymphocytes Relative: 9 %
Lymphs Abs: 0.9 10*3/uL — ABNORMAL LOW (ref 1.0–3.6)
MCH: 31.8 pg (ref 26.0–34.0)
MCHC: 33.8 g/dL (ref 32.0–36.0)
MCV: 93.9 fL (ref 80.0–100.0)
MONO ABS: 0.4 10*3/uL (ref 0.2–0.9)
MONOS PCT: 4 %
NEUTROS ABS: 8.6 10*3/uL — AB (ref 1.4–6.5)
Neutrophils Relative %: 86 %
Platelets: 194 10*3/uL (ref 150–440)
RBC: 4.41 MIL/uL (ref 3.80–5.20)
RDW: 12.6 % (ref 11.5–14.5)
WBC: 10 10*3/uL (ref 3.6–11.0)

## 2014-10-28 LAB — COMPREHENSIVE METABOLIC PANEL
ALBUMIN: 4.5 g/dL (ref 3.5–5.0)
ALK PHOS: 44 U/L (ref 38–126)
ALT: 16 U/L (ref 14–54)
AST: 22 U/L (ref 15–41)
Anion gap: 7 (ref 5–15)
BILIRUBIN TOTAL: 0.8 mg/dL (ref 0.3–1.2)
BUN: 17 mg/dL (ref 6–20)
CALCIUM: 9.1 mg/dL (ref 8.9–10.3)
CO2: 22 mmol/L (ref 22–32)
CREATININE: 0.83 mg/dL (ref 0.44–1.00)
Chloride: 109 mmol/L (ref 101–111)
GFR calc Af Amer: >60 mL/min (ref >60)
GLUCOSE: 105 mg/dL — AB (ref 65–99)
POTASSIUM: 3.8 mmol/L (ref 3.5–5.1)
Sodium: 138 mmol/L (ref 135–145)
TOTAL PROTEIN: 7.3 g/dL (ref 6.5–8.1)

## 2014-10-28 MED ORDER — KETOROLAC TROMETHAMINE 30 MG/ML IJ SOLN
30.0000 mg | Freq: Once | INTRAMUSCULAR | Status: AC
  Administered 2014-10-28: 30 mg via INTRAVENOUS

## 2014-10-28 MED ORDER — SODIUM CHLORIDE 0.9 % IV BOLUS (SEPSIS)
1000.0000 mL | Freq: Once | INTRAVENOUS | Status: AC
  Administered 2014-10-28: 1000 mL via INTRAVENOUS

## 2014-10-28 MED ORDER — KETOROLAC TROMETHAMINE 30 MG/ML IJ SOLN
INTRAMUSCULAR | Status: AC
  Administered 2014-10-28: 30 mg via INTRAVENOUS
  Filled 2014-10-28: qty 1

## 2014-10-28 MED ORDER — ONDANSETRON HCL 4 MG/2ML IJ SOLN
4.0000 mg | Freq: Once | INTRAMUSCULAR | Status: AC
  Administered 2014-10-28: 4 mg via INTRAVENOUS
  Filled 2014-10-28: qty 2

## 2014-10-28 MED ORDER — ONDANSETRON HCL 4 MG PO TABS: 4.0000 mg | ORAL_TABLET | Freq: Every day | ORAL | Status: DC | PRN

## 2014-10-28 NOTE — ED Notes (Signed)
Pt to ed with abd pain, n/v/d since 4 am.  Pt states feels weak and dizzy when standing.

## 2014-10-28 NOTE — Discharge Instructions (Signed)
Please seek medical attention for any high fevers, chest pain, shortness of breath, change in behavior, persistent vomiting, bloody stool or any other new or concerning symptoms. ° ° °Nausea and Vomiting °Nausea is a sick feeling that often comes before throwing up (vomiting). Vomiting is a reflex where stomach contents come out of your mouth. Vomiting can cause severe loss of body fluids (dehydration). Children and elderly adults can become dehydrated quickly, especially if they also have diarrhea. Nausea and vomiting are symptoms of a condition or disease. It is important to find the cause of your symptoms. °CAUSES  °· Direct irritation of the stomach lining. This irritation can result from increased acid production (gastroesophageal reflux disease), infection, food poisoning, taking certain medicines (such as nonsteroidal anti-inflammatory drugs), alcohol use, or tobacco use. °· Signals from the brain. These signals could be caused by a headache, heat exposure, an inner ear disturbance, increased pressure in the brain from injury, infection, a tumor, or a concussion, pain, emotional stimulus, or metabolic problems. °· An obstruction in the gastrointestinal tract (bowel obstruction). °· Illnesses such as diabetes, hepatitis, gallbladder problems, appendicitis, kidney problems, cancer, sepsis, atypical symptoms of a heart attack, or eating disorders. °· Medical treatments such as chemotherapy and radiation. °· Receiving medicine that makes you sleep (general anesthetic) during surgery. °DIAGNOSIS °Your caregiver may ask for tests to be done if the problems do not improve after a few days. Tests may also be done if symptoms are severe or if the reason for the nausea and vomiting is not clear. Tests may include: °· Urine tests. °· Blood tests. °· Stool tests. °· Cultures (to look for evidence of infection). °· X-rays or other imaging studies. °Test results can help your caregiver make decisions about treatment or the  need for additional tests. °TREATMENT °You need to stay well hydrated. Drink frequently but in small amounts. You may wish to drink water, sports drinks, clear broth, or eat frozen ice pops or gelatin dessert to help stay hydrated. When you eat, eating slowly may help prevent nausea. There are also some antinausea medicines that may help prevent nausea. °HOME CARE INSTRUCTIONS  °· Take all medicine as directed by your caregiver. °· If you do not have an appetite, do not force yourself to eat. However, you must continue to drink fluids. °· If you have an appetite, eat a normal diet unless your caregiver tells you differently. °¨ Eat a variety of complex carbohydrates (rice, wheat, potatoes, bread), lean meats, yogurt, fruits, and vegetables. °¨ Avoid high-fat foods because they are more difficult to digest. °· Drink enough water and fluids to keep your urine clear or pale yellow. °· If you are dehydrated, ask your caregiver for specific rehydration instructions. Signs of dehydration may include: °¨ Severe thirst. °¨ Dry lips and mouth. °¨ Dizziness. °¨ Dark urine. °¨ Decreasing urine frequency and amount. °¨ Confusion. °¨ Rapid breathing or pulse. °SEEK IMMEDIATE MEDICAL CARE IF:  °· You have blood or brown flecks (like coffee grounds) in your vomit. °· You have black or bloody stools. °· You have a severe headache or stiff neck. °· You are confused. °· You have severe abdominal pain. °· You have chest pain or trouble breathing. °· You do not urinate at least once every 8 hours. °· You develop cold or clammy skin. °· You continue to vomit for longer than 24 to 48 hours. °· You have a fever. °MAKE SURE YOU:  °· Understand these instructions. °· Will watch your condition. °· Will get help right away   if you are not doing well or get worse. °  °This information is not intended to replace advice given to you by your health care provider. Make sure you discuss any questions you have with your health care provider. °    °Document Released: 12/30/2004 Document Revised: 03/24/2011 Document Reviewed: 05/29/2010 °Elsevier Interactive Patient Education ©2016 [employer] ° °

## 2014-10-28 NOTE — ED Provider Notes (Signed)
[facility] Emergency Department Provider Note    ____________________________________________  Time seen: 0830  I have reviewed the triage vital signs and the nursing notes.   HISTORY  Chief Complaint Emesis and Diarrhea   History limited by: Not Limited   HPI Laura Blackburn is a 44 y.o. female who presents to the emergency department today with concerns for nausea, vomiting and diarrhea. She states that the symptoms started roughly 4-1/2 hours ago. Started all of a sudden. It has been constant since then. She states she has roughly had 8 episodes of both vomiting and diarrhea. She denies any blood in either the vomit or the diarrhea. She has had accompanied shakes. Additionally she complains of some body aches. No focal abdominal pain. States no one who ate the [place] she had last night has been sick other than her self. Denies any sick contacts.   Past Medical History  Diagnosis Date  . Anemia     iron deficiency  . Depression   . GERD (gastroesophageal reflux disease)   . Hypertension     Patient Active Problem List   Diagnosis Date Noted  . Head pain 03/10/2012  . Influenza-like illness 12/09/2010  . Elbow pain, left 09/30/2010  . Shoulder pain, left 09/30/2010  . Sinusitis 09/30/2010  . Leg pain 07/09/2010  . HERPES ZOSTER 04/04/2007  . ANEMIA-IRON DEFICIENCY 11/13/2006  . DEPRESSION 11/13/2006  . HYPERTENSION 11/13/2006  . GERD 11/13/2006    Past Surgical History  Procedure Laterality Date  . Tympanostomy tube placement      2 sets of ear tubes  . Lithotripsy  2004    for kidney stones  . Mandible fracture surgery  1987  . Tonsillectomy  1979  . Tubal ligation  1995    BTL  . Btl reversal  2006  . Cesarean section  2007  . Laparoscopy for ectopic pregnancy  08/2006    left side  . Miscarriage  09/2006    Current Outpatient Rx  Name  Route  Sig  Dispense  Refill  . acetaminophen (TYLENOL) 500 MG tablet   Oral    Take 500 mg by mouth every 6 (six) hours as needed.           Marland Kitchen EXPIRED: fluticasone (FLONASE) 50 MCG/ACT nasal spray   Nasal   Place 2 sprays into the nose daily.   16 g   3   . Multiple Vitamin (MULTIVITAMIN) tablet   Oral   Take 1 tablet by mouth daily.             Allergies Oxycodone-acetaminophen  Family History  Problem Relation Age of Onset  . Diabetes Father   . Cancer Paternal Uncle     lung CA  . Alzheimer's disease Maternal Grandmother   . Heart disease Paternal Grandfather 56    MI  . Breast cancer Maternal Aunt 52    Social History Social History  Substance Use Topics  . Smoking status: Never Smoker   . Smokeless tobacco: None  . Alcohol Use: No    Review of Systems  Constitutional: Negative for fever. Cardiovascular: Negative for chest pain. Respiratory: Negative for shortness of breath. Gastrointestinal: Negative for abdominal pain. Positive for nausea and vomiting and diarrhea Genitourinary: Negative for dysuria. Musculoskeletal: Negative for back pain. Skin: Negative for rash. Neurological: Negative for headaches, focal weakness or numbness.   10-point ROS otherwise negative.  ____________________________________________   PHYSICAL EXAM:  VITAL SIGNS: ED Triage Vitals  Enc Vitals  Group     BP 10/28/14 0800 100/69 mmHg     Pulse Rate 10/28/14 0800 94     Resp 10/28/14 0800 20     Temp 10/28/14 0800 97.8 F (36.6 C)     Temp Source 10/28/14 0800 Oral     SpO2 10/28/14 0800 100 %     Weight 10/28/14 0800 134 lb (60.782 kg)     Height 10/28/14 0800 5\' 4"  (1.626 m)     Head Cir --      Peak Flow --      Pain Score 10/28/14 0801 2   Constitutional: Alert and oriented. Well appearing and in no distress. Eyes: Conjunctivae are normal. PERRL. Normal extraocular movements. ENT   Head: Normocephalic and atraumatic.   Nose: No congestion/rhinnorhea.   Mouth/Throat: Mucous membranes are moist.   Neck: No  stridor. Hematological/Lymphatic/Immunilogical: No cervical lymphadenopathy. Cardiovascular: Normal rate, regular rhythm.  No murmurs, rubs, or gallops. Respiratory: Normal respiratory effort without tachypnea nor retractions. Breath sounds are clear and equal bilaterally. No wheezes/rales/rhonchi. Gastrointestinal: Soft and nontender. No distention. There is no CVA tenderness. Genitourinary: Deferred Musculoskeletal: Normal range of motion in all extremities. No joint effusions.  No lower extremity tenderness nor edema. Neurologic:  Normal speech and language. No gross focal neurologic deficits are appreciated. Speech is normal.  Skin:  Skin is warm, dry and intact. No rash noted. Psychiatric: Mood and affect are normal. Speech and behavior are normal. Patient exhibits appropriate insight and judgment.  ____________________________________________    LABS (pertinent positives/negatives)  Labs Reviewed  COMPREHENSIVE METABOLIC PANEL - Abnormal; Notable for the following:    Glucose, Bld 105 (*)    All other components within normal limits  CBC WITH DIFFERENTIAL/PLATELET - Abnormal; Notable for the following:    Neutro Abs 8.6 (*)    Lymphs Abs 0.9 (*)    All other components within normal limits  LIPASE, BLOOD     ____________________________________________   EKG  None  ____________________________________________    RADIOLOGY  None   ____________________________________________   PROCEDURES  Procedure(s) performed: None  Critical Care performed: No  ____________________________________________   INITIAL IMPRESSION / ASSESSMENT AND PLAN / ED COURSE  Pertinent labs & imaging results that were available during my care of the patient were reviewed by me and considered in my medical decision making (see chart for details).  Patient presented to the ER today with n/v/d. No tenderness on exam. Blood work without concerning findings. Patient felt much better  after medication and IV fluids.   ____________________________________________   FINAL CLINICAL IMPRESSION(S) / ED DIAGNOSES  Final diagnoses:  Non-intractable vomiting with nausea, vomiting of unspecified type  Diarrhea, unspecified type     Nance Pear, MD 10/28/14 1030

## 2015-02-12 ENCOUNTER — Emergency Department: Payer: BLUE CROSS/BLUE SHIELD

## 2015-02-12 ENCOUNTER — Encounter: Payer: Self-pay | Admitting: Emergency Medicine

## 2015-02-12 ENCOUNTER — Emergency Department
Admission: EM | Admit: 2015-02-12 | Discharge: 2015-02-12 | Disposition: A | Discharge status: Home / Self Care | Payer: BLUE CROSS/BLUE SHIELD | Attending: Emergency Medicine | Admitting: Emergency Medicine

## 2015-02-12 DIAGNOSIS — Z7951 Long term (current) use of inhaled steroids: Secondary | ICD-10-CM | POA: Diagnosis not present

## 2015-02-12 DIAGNOSIS — I10 Essential (primary) hypertension: Secondary | ICD-10-CM | POA: Diagnosis not present

## 2015-02-12 DIAGNOSIS — M769 Unspecified enthesopathy, lower limb, excluding foot: Secondary | ICD-10-CM | POA: Insufficient documentation

## 2015-02-12 DIAGNOSIS — M25562 Pain in left knee: Secondary | ICD-10-CM | POA: Diagnosis present

## 2015-02-12 DIAGNOSIS — M76899 Other specified enthesopathies of unspecified lower limb, excluding foot: Secondary | ICD-10-CM

## 2015-02-12 NOTE — ED Notes (Signed)
States she developed pain to post left lower leg since Thursday  Denies any injury increased pain with flexion

## 2015-02-12 NOTE — Discharge Instructions (Signed)
Tendinitis and Tenosynovitis  °Tendinitis is inflammation of the tendon. Tenosynovitis is inflammation of the lining around the tendon (tendon sheath). These painful conditions often occur at once. Tendons attach muscle to bone. To move a limb, force from the muscle moves through the tendon, to the bone. These conditions often cause increased pain when moving. Tendinitis may be caused by a small or partial tear in the tendon.  °SYMPTOMS  °· Pain, tenderness, redness, bruising, or swelling at the injury. °· Loss of normal joint movement. °· Pain that gets worse with use of the muscle and joint attached to the tendon. °· Weakness in the tendon, caused by calcium build up that may occur with tendinitis. °· Commonly affected tendons: °¨ Achilles tendon (calf of leg). °¨ Rotator cuff (shoulder joint). °¨ Patellar tendon (kneecap to shin). °¨ Peroneal tendon (ankle). °¨ Posterior tibial tendon (inner ankle). °¨ Biceps tendon (in front of shoulder). °CAUSES  °· Sudden strain on a flexed muscle, muscle overuse, sudden increase or change in activity, vigorous activity. °· Result of a direct hit (less common). °· Poor muscle action (biomechanics). °RISK INCREASES WITH: °· Injury (trauma). °· Too much exercise. °· Sudden change in athletic activity. °· Incorrect exercise form or technique. °· Poor strength and flexibility. °· Not warming-up properly before activity. °· Returning to activity before healing is complete. °PREVENTION  °· Warm-up and stretch properly before activity. °· Maintain physical fitness: °¨ Joint flexibility. °¨ Muscle strength and endurance. °¨ Fitness that increases heart rate. °· Learn and use proper exercise techniques. °· Use rehabilitation exercises to strengthen weak muscles and tendons. °· Ice the tendon after activity, to reduce recurring inflammation. °· Wear proper fitting protective equipment for specific tendons, when indicated. °PROGNOSIS  °When treated properly, can be cured in 6 to 8 weeks.  Recovery may take longer, depending on degree of injury.  °RELATED COMPLICATIONS  °· Re-injury or recurring symptoms. °· Permanent weakness or joint stiffness, if injury is severe and recovery is not completed. °· Delayed healing, if sports are started before healing is complete. °· Tearing apart (rupture) of the inflamed tendon. Tendinitis means the tendon is injured and must recover. °TREATMENT  °Treatment first involves ice, medicine, and rest from aggravating activities. This reduces pain and inflammation. Modifying your activity may be considered to prevent recurring injury. A brace, elastic bandage wrap, splint, cast, or sling may be prescribed to protect the joint for a short period. After that period, strengthening and stretching exercise may help to regain strength and full range of motion. If the condition persists, despite non-surgical treatment, surgery may be recommended to remove the inflamed tendon lining. Corticosteroid injections may be given to reduce inflammation. However, these injections may weaken the tendon and increase your risk for tendon rupture. °MEDICATION  °· If pain medicine is needed, nonsteroidal anti-inflammatory medicines (aspirin and ibuprofen), or other minor pain relievers (acetaminophen), are often recommended. °· Do not take pain medicine for 7 days before surgery. °· Prescription pain relievers are usually prescribed only after surgery. Use only as directed and only as much as you need. °· Ointments applied to the skin may be helpful. °· Corticosteroid injections may be given to reduce inflammation. However, this may increase your risk of a tendon rupture. °HEAT AND COLD °· Cold treatment (icing) relieves pain and reduces inflammation. Cold treatment should be applied for 10 to 15 minutes every 2 to 3 hours, and immediately after activity that aggravates your symptoms. Use ice packs or an ice massage. °· Heat   treatment may be used before performing stretching and strengthening  activities prescribed by your caregiver, physical therapist, or athletic trainer. Use a heat pack or a warm water soak. °SEEK MEDICAL CARE IF:  °· Symptoms get worse or do not improve, despite treatment. °· Pain becomes too much to tolerate. °· You develop numbness or tingling. °· Toes become cold, or toenails become blue, gray, or dark colored. °· New, unexplained symptoms develop. (Drugs used in treatment may produce side effects.) °  °This information is not intended to replace advice given to you by your health care provider. Make sure you discuss any questions you have with your health care provider. °  °Document Released: 12/30/2004 Document Revised: 03/24/2011 Document Reviewed: 04/13/2008 °Elsevier Interactive Patient Education ©2016 Elsevier Inc. ° °

## 2015-02-12 NOTE — ED Provider Notes (Signed)
[facility] Emergency Department Provider Note  ____________________________________________  Time seen: On arrival  I have reviewed the triage vital signs and the nursing notes.   HISTORY  Chief Complaint Leg Pain    HPI Laura Blackburn is a 45 y.o. female who presents with complaints of left lateral knee discomfort. She reports this has occurred over the last 4 days. She denies injury to the area. She does report using the treadmill every single day. She called her orthopedist who recommended she come to the emergency department to rule out blood clot. No fevers or chills.    Past Medical History  Diagnosis Date  . Anemia     iron deficiency  . Depression   . GERD (gastroesophageal reflux disease)   . Hypertension     Patient Active Problem List   Diagnosis Date Noted  . Head pain 03/10/2012  . Influenza-like illness 12/09/2010  . Elbow pain, left 09/30/2010  . Shoulder pain, left 09/30/2010  . Sinusitis 09/30/2010  . Leg pain 07/09/2010  . HERPES ZOSTER 04/04/2007  . ANEMIA-IRON DEFICIENCY 11/13/2006  . DEPRESSION 11/13/2006  . HYPERTENSION 11/13/2006  . GERD 11/13/2006    Past Surgical History  Procedure Laterality Date  . Tympanostomy tube placement      2 sets of ear tubes  . Lithotripsy  2004    for kidney stones  . Mandible fracture surgery  1987  . Tonsillectomy  1979  . Tubal ligation  1995    BTL  . Btl reversal  2006  . Cesarean section  2007  . Laparoscopy for ectopic pregnancy  08/2006    left side  . Miscarriage  09/2006    Current Outpatient Rx  Name  Route  Sig  Dispense  Refill  . acetaminophen (TYLENOL) 500 MG tablet   Oral   Take 1,000 mg by mouth every 6 (six) hours as needed for mild pain, moderate pain, fever or headache.          Marland Kitchen EXPIRED: fluticasone (FLONASE) 50 MCG/ACT nasal spray   Nasal   Place 2 sprays into the nose daily.   16 g   3   . ondansetron (ZOFRAN) 4 MG tablet   Oral  Take 1 tablet (4 mg total) by mouth daily as needed for nausea or vomiting.   20 tablet   0     Allergies Oxycodone-acetaminophen  Family History  Problem Relation Age of Onset  . Diabetes Father   . Cancer Paternal Uncle     lung CA  . Alzheimer's disease Maternal Grandmother   . Heart disease Paternal Grandfather 49    MI  . Breast cancer Maternal Aunt 52    Social History Social History  Substance Use Topics  . Smoking status: Never Smoker   . Smokeless tobacco: None  . Alcohol Use: No    Review of Systems  Constitutional: Negative for fever. Eyes: Negative for visual changes. ENT: Negative for sore throat   Genitourinary: Negative for dysuria. Musculoskeletal: Negative for back pain. Skin: Negative for rash. Neurological: Negative for headaches or focal weakness   ____________________________________________   PHYSICAL EXAM:  VITAL SIGNS: ED Triage Vitals  Enc Vitals Group     BP 02/12/15 1324 116/79 mmHg     Pulse Rate 02/12/15 1324 81     Resp 02/12/15 1324 18     Temp 02/12/15 1324 97.5 F (36.4 C)     Temp Source 02/12/15 1324 Oral     SpO2  02/12/15 1324 100 %     Weight 02/12/15 1324 140 lb (63.504 kg)     Height 02/12/15 1324 5\' 4"  (1.626 m)     Head Cir --      Peak Flow --      Pain Score 02/12/15 1324 8     Pain Loc --      Pain Edu? --      Excl. in [place]? --      Constitutional: Alert and oriented. Well appearing and in no distress. Eyes: Conjunctivae are normal.  ENT   Head: Normocephalic and atraumatic.   Mouth/Throat: Mucous membranes are moist. Cardiovascular: Normal rate, regular rhythm.  Respiratory: Normal respiratory effort without tachypnea nor retractions.  Gastrointestinal: Soft and non-tender in all quadrants. No distention. There is no CVA tenderness. Musculoskeletal: Nontender with normal range of motion in all extremities. Mild tenderness to palpation just inferior to the left lateral knee at the insertion  site of the biceps femoris. No joint effusion Neurologic:  Normal speech and language. No gross focal neurologic deficits are appreciated. Skin:  Skin is warm, dry and intact. No rash noted. Psychiatric: Mood and affect are normal. Patient exhibits appropriate insight and judgment.  ____________________________________________    LABS (pertinent positives/negatives)  Labs Reviewed - No data to display  ____________________________________________     ____________________________________________    RADIOLOGY I have personally reviewed any xrays that were ordered on this patient: Ultrasound negative for DVT  ____________________________________________   PROCEDURES  Procedure(s) performed: none   ____________________________________________   INITIAL IMPRESSION / ASSESSMENT AND PLAN / ED COURSE  Pertinent labs & imaging results that were available during my care of the patient were reviewed by me and considered in my medical decision making (see chart for details).  Exam is consistent with biceps femoris tendinitis. Ultrasound negative for DVT. Recommend rice and orthopedic follow-up  ____________________________________________   FINAL CLINICAL IMPRESSION(S) / ED DIAGNOSES  Final diagnoses:  Biceps femoris tendonitis     Lavonia Drafts, MD 02/12/15 [phone]

## 2015-02-12 NOTE — ED Notes (Signed)
Pt c/o pain to lateral side of left knee.  Feels it is a little swollen.  Pt was sent for r/o blood clot per her ortho doctor.  Pain goes down back of left calf. No redness or warmth noted to left calf.

## 2015-06-05 DIAGNOSIS — B349 Viral infection, unspecified: Secondary | ICD-10-CM | POA: Diagnosis not present

## 2015-06-05 DIAGNOSIS — M791 Myalgia: Secondary | ICD-10-CM | POA: Diagnosis not present

## 2015-06-05 DIAGNOSIS — W57XXXA Bitten or stung by nonvenomous insect and other nonvenomous arthropods, initial encounter: Secondary | ICD-10-CM | POA: Diagnosis not present

## 2015-06-06 ENCOUNTER — Encounter: Payer: Self-pay | Admitting: Emergency Medicine

## 2015-06-06 ENCOUNTER — Emergency Department
Admission: EM | Admit: 2015-06-06 | Discharge: 2015-06-06 | Disposition: A | Discharge status: Home / Self Care | Payer: BLUE CROSS/BLUE SHIELD | Attending: Emergency Medicine | Admitting: Emergency Medicine

## 2015-06-06 DIAGNOSIS — M2550 Pain in unspecified joint: Secondary | ICD-10-CM | POA: Insufficient documentation

## 2015-06-06 DIAGNOSIS — F329 Major depressive disorder, single episode, unspecified: Secondary | ICD-10-CM | POA: Insufficient documentation

## 2015-06-06 DIAGNOSIS — R51 Headache: Secondary | ICD-10-CM | POA: Diagnosis not present

## 2015-06-06 DIAGNOSIS — R110 Nausea: Secondary | ICD-10-CM | POA: Insufficient documentation

## 2015-06-06 DIAGNOSIS — I10 Essential (primary) hypertension: Secondary | ICD-10-CM | POA: Diagnosis not present

## 2015-06-06 DIAGNOSIS — M25541 Pain in joints of right hand: Secondary | ICD-10-CM | POA: Diagnosis not present

## 2015-06-06 MED ORDER — KETOROLAC TROMETHAMINE 10 MG PO TABS: 10.0000 mg | ORAL_TABLET | Freq: Four times a day (QID) | ORAL | Status: DC | PRN

## 2015-06-06 MED ORDER — KETOROLAC TROMETHAMINE 60 MG/2ML IM SOLN
60.0000 mg | Freq: Once | INTRAMUSCULAR | Status: AC
  Administered 2015-06-06: 60 mg via INTRAMUSCULAR
  Filled 2015-06-06: qty 2

## 2015-06-06 MED ORDER — ONDANSETRON HCL 8 MG PO TABS: 8.0000 mg | ORAL_TABLET | Freq: Three times a day (TID) | ORAL | Status: DC | PRN

## 2015-06-06 MED ORDER — ONDANSETRON 8 MG PO TBDP
8.0000 mg | ORAL_TABLET | Freq: Once | ORAL | Status: AC
  Administered 2015-06-06: 8 mg via ORAL
  Filled 2015-06-06: qty 1

## 2015-06-06 NOTE — ED Provider Notes (Signed)
[facility] Emergency Department Provider Note   ____________________________________________  Time seen: Approximately 6:48 PM  I have reviewed the triage vital signs and the nursing notes.   HISTORY  Chief Complaint Insect Bite    HPI Laura Blackburn is a 45 y.o. female patient states she was. On the volar aspect of the right hand 1 week ago. Patient denies any insect bite. Patient states since the incident she's been having body aches and headaches. Yesterday the patient went to urgent care clinic and blood work was taken to check for Lyme disease in [facility] fever. Patient was given doxycycline but has not noticed relief. Patient was told I was also not be back until Friday this week. Patient states she called urgent care clinic and a total since she was feeling worse come to the ED. Patient denies any fever or chills associated this complaint. Patient is complaining of joint pain and malaise. Patient also state there is some intermitting nausea. She has decreased appetite secondary to the nausea. No other palliative measures for this complaint. Patient rates her pain discomfort as a 10 over 10. Past Medical History  Diagnosis Date  . Anemia     iron deficiency  . Depression   . GERD (gastroesophageal reflux disease)   . Hypertension     Patient Active Problem List   Diagnosis Date Noted  . Head pain 03/10/2012  . Influenza-like illness 12/09/2010  . Elbow pain, left 09/30/2010  . Shoulder pain, left 09/30/2010  . Sinusitis 09/30/2010  . Leg pain 07/09/2010  . HERPES ZOSTER 04/04/2007  . ANEMIA-IRON DEFICIENCY 11/13/2006  . DEPRESSION 11/13/2006  . HYPERTENSION 11/13/2006  . GERD 11/13/2006    Past Surgical History  Procedure Laterality Date  . Tympanostomy tube placement      2 sets of ear tubes  . Lithotripsy  2004    for kidney stones  . Mandible fracture surgery  1987  . Tonsillectomy  1979  . Tubal ligation  1995    BTL  .  Btl reversal  2006  . Cesarean section  2007  . Laparoscopy for ectopic pregnancy  08/2006    left side  . Miscarriage  09/2006    Current Outpatient Rx  Name  Route  Sig  Dispense  Refill  . acetaminophen (TYLENOL) 500 MG tablet   Oral   Take 1,000 mg by mouth every 6 (six) hours as needed for mild pain, moderate pain, fever or headache.          Marland Kitchen EXPIRED: fluticasone (FLONASE) 50 MCG/ACT nasal spray   Nasal   Place 2 sprays into the nose daily.   16 g   3   . ketorolac (TORADOL) 10 MG tablet   Oral   Take 1 tablet (10 mg total) by mouth every 6 (six) hours as needed.   20 tablet   0   . ondansetron (ZOFRAN) 4 MG tablet   Oral   Take 1 tablet (4 mg total) by mouth daily as needed for nausea or vomiting.   20 tablet   0   . ondansetron (ZOFRAN) 8 MG tablet   Oral   Take 1 tablet (8 mg total) by mouth every 8 (eight) hours as needed for nausea or vomiting.   20 tablet   0     Allergies Oxycodone-acetaminophen  Family History  Problem Relation Age of Onset  . Diabetes Father   . Cancer Paternal Uncle     lung CA  .  Alzheimer's disease Maternal Grandmother   . Heart disease Paternal Grandfather 53    MI  . Breast cancer Maternal Aunt 52    Social History Social History  Substance Use Topics  . Smoking status: Never Smoker   . Smokeless tobacco: None  . Alcohol Use: No    Review of Systems Constitutional: No fever/chills. Body aches.  Eyes: No visual changes. ENT: No sore throat. Cardiovascular: Denies chest pain. Respiratory: Denies shortness of breath. Gastrointestinal: No abdominal pain.  No nausea, no vomiting.  No diarrhea.  No constipation. Genitourinary: Negative for dysuria. Musculoskeletal: Negative for back pain. Skin: Negative for rash. Neurological: Positive for headaches, but denies focal weakness or numbness. Psychiatric:Depression Endocrine:Hypertension Hematological/Lymphatic:Anemia Allergic/Immunilogical: Percocets  ___________________________________________   PHYSICAL EXAM:  VITAL SIGNS: ED Triage Vitals  Enc Vitals Group     BP 06/06/15 1823 139/46 mmHg     Pulse Rate 06/06/15 1823 99     Resp 06/06/15 1823 20     Temp 06/06/15 1823 99.3 F (37.4 C)     Temp Source 06/06/15 1823 Oral     SpO2 06/06/15 1823 100 %     Weight 06/06/15 1823 140 lb (63.504 kg)     Height 06/06/15 1823 5\' 4"  (1.626 m)     Head Cir --      Peak Flow --      Pain Score 06/06/15 1823 10     Pain Loc --      Pain Edu? --      Excl. in [place]? --     Constitutional: Alert and oriented. Well appearing and in no acute distress. Eyes: Conjunctivae are normal. PERRL. EOMI. Head: Atraumatic. Nose: No congestion/rhinnorhea. Mouth/Throat: Mucous membranes are moist.  Oropharynx non-erythematous. Neck: No stridor.  No cervical spine tenderness to palpation. Hematological/Lymphatic/Immunilogical: No cervical lymphadenopathy. Cardiovascular: Normal rate, regular rhythm. Grossly normal heart sounds.  Good peripheral circulation. Respiratory: Normal respiratory effort.  No retractions. Lungs CTAB. Gastrointestinal: Soft and nontender. No distention. No abdominal bruits. No CVA tenderness. Musculoskeletal: No lower extremity tenderness nor edema.  No joint effusions. Neurologic:  Normal speech and language. No gross focal neurologic deficits are appreciated. No gait instability. Skin:  Skin is warm, dry and intact. No rash noted. Psychiatric: Mood and affect are normal. Speech and behavior are normal.  ____________________________________________   LABS (all labs ordered are listed, but only abnormal results are displayed)  Labs Reviewed - No data to display ____________________________________________  EKG   ____________________________________________  RADIOLOGY   ____________________________________________   PROCEDURES  Procedure(s) performed: None  Critical Care performed:  No  ____________________________________________   INITIAL IMPRESSION / ASSESSMENT AND PLAN / ED COURSE  Pertinent labs & imaging results that were available during my care of the patient were reviewed by me and considered in my medical decision making (see chart for details).  Arthralgia. Patient given a prescription for Zofran and Toradol. Patient given discharge Instructions and advised to follow-up with urgent care clinic for lab results in 2 days. ____________________________________________   FINAL CLINICAL IMPRESSION(S) / ED DIAGNOSES  Final diagnoses:  Arthralgia  Nausea without vomiting      NEW MEDICATIONS STARTED DURING THIS VISIT:  New Prescriptions   KETOROLAC (TORADOL) 10 MG TABLET    Take 1 tablet (10 mg total) by mouth every 6 (six) hours as needed.   ONDANSETRON (ZOFRAN) 8 MG TABLET    Take 1 tablet (8 mg total) by mouth every 8 (eight) hours as needed for nausea or vomiting.  Note:  This document was prepared using Dragon voice recognition software and may include unintentional dictation errors.                 Sable Feil, PA-C 06/06/15 1910  Earleen Newport, MD 06/06/15 [id number]

## 2015-06-06 NOTE — ED Notes (Signed)
Discussed discharge instructions, prescriptions, and follow-up care with patient. No questions or concerns at this time. Pt stable at discharge.  

## 2015-06-06 NOTE — ED Notes (Signed)
Pt reports possible tick or spider bite on mothers day and since have been having body aches and headaches. Was seen at urgent care yesterday for same and was given doxycycline with not relief.

## 2015-06-06 NOTE — Discharge Instructions (Signed)
Take medications as directed and follow-up with urgent care clinic in 2 days.

## 2015-09-13 DIAGNOSIS — N912 Amenorrhea, unspecified: Secondary | ICD-10-CM | POA: Diagnosis not present

## 2015-09-13 DIAGNOSIS — N951 Menopausal and female climacteric states: Secondary | ICD-10-CM | POA: Diagnosis not present

## 2016-02-15 DIAGNOSIS — M791 Myalgia: Secondary | ICD-10-CM | POA: Diagnosis not present

## 2016-02-15 DIAGNOSIS — R509 Fever, unspecified: Secondary | ICD-10-CM | POA: Diagnosis not present

## 2016-02-16 ENCOUNTER — Emergency Department
Admission: EM | Admit: 2016-02-16 | Discharge: 2016-02-16 | Disposition: A | Discharge status: Home / Self Care | Payer: BLUE CROSS/BLUE SHIELD | Attending: Emergency Medicine | Admitting: Emergency Medicine

## 2016-02-16 DIAGNOSIS — N9489 Other specified conditions associated with female genital organs and menstrual cycle: Secondary | ICD-10-CM | POA: Insufficient documentation

## 2016-02-16 DIAGNOSIS — Z79899 Other long term (current) drug therapy: Secondary | ICD-10-CM | POA: Diagnosis not present

## 2016-02-16 DIAGNOSIS — N939 Abnormal uterine and vaginal bleeding, unspecified: Secondary | ICD-10-CM | POA: Diagnosis not present

## 2016-02-16 DIAGNOSIS — R55 Syncope and collapse: Secondary | ICD-10-CM | POA: Diagnosis not present

## 2016-02-16 DIAGNOSIS — I10 Essential (primary) hypertension: Secondary | ICD-10-CM | POA: Insufficient documentation

## 2016-02-16 LAB — BASIC METABOLIC PANEL
ANION GAP: 7 (ref 5–15)
BUN: 16 mg/dL (ref 6–20)
CALCIUM: 9 mg/dL (ref 8.9–10.3)
CO2: 26 mmol/L (ref 22–32)
CREATININE: 0.97 mg/dL (ref 0.44–1.00)
Chloride: 104 mmol/L (ref 101–111)
GFR calc Af Amer: >60 mL/min (ref >60)
GLUCOSE: 94 mg/dL (ref 65–99)
Potassium: 4.1 mmol/L (ref 3.5–5.1)
Sodium: 137 mmol/L (ref 135–145)

## 2016-02-16 LAB — CBC
HCT: 40.7 % (ref 35.0–47.0)
HEMOGLOBIN: 13.7 g/dL (ref 12.0–16.0)
MCH: 31.5 pg (ref 26.0–34.0)
MCHC: 33.6 g/dL (ref 32.0–36.0)
MCV: 93.5 fL (ref 80.0–100.0)
Platelets: 193 10*3/uL (ref 150–440)
RBC: 4.35 MIL/uL (ref 3.80–5.20)
RDW: 13.1 % (ref 11.5–14.5)
WBC: 3.7 10*3/uL (ref 3.6–11.0)

## 2016-02-16 LAB — HCG, QUANTITATIVE, PREGNANCY

## 2016-02-16 LAB — POCT PREGNANCY, URINE: Preg Test, Ur: NEGATIVE

## 2016-02-16 MED ORDER — SODIUM CHLORIDE 0.9 % IV BOLUS (SEPSIS)
1000.0000 mL | Freq: Once | INTRAVENOUS | Status: AC
  Administered 2016-02-16: 1000 mL via INTRAVENOUS

## 2016-02-16 NOTE — ED Triage Notes (Signed)
Pt reports to ED w/ c/o vaginal bleeding that began today.  Pt sts that she also has been having flu like s/s x 1 week.  Pt sts she was already seen for flu like s/s.  Pt ambulatory w/o issue.  Pt sts that vaginal bleeding started today, sts that she is "passing clots the size of tangerines".  Pt alert and oriented. Resp even and unlabored.  Pt sts that she has no energy and feeling cold.  Denies CP, SOB, n/v/d, or LOC.

## 2016-02-16 NOTE — ED Provider Notes (Signed)
[facility] Emergency Department Provider Note  Time seen: 9:54 PM  I have reviewed the triage vital signs and the nursing notes.   HISTORY  Chief Complaint Vaginal Bleeding    HPI Laura Blackburn is a 46 y.o. female with a past medical history of anemia, gastric reflux, hypertension, presents to the emergency department vaginal bleeding and a syncope episode. According to the patient she is perimenopausal, has not had a menstrual cycle and 10 months however beginningtoday she began having vaginal bleeding and passed one large clot. After passing the large clot she felt lightheaded and felt like she is going to pass out or possibly briefly passed out. Patient states she is feeling much better now but came in to be evaluated. Patient also states for the past one week she has been feeling fatigued with occasional cough, denies any chest pain trouble breathing nausea vomiting diarrhea.  Past Medical History:  Diagnosis Date  . Anemia    iron deficiency  . Depression   . GERD (gastroesophageal reflux disease)   . Hypertension     Patient Active Problem List   Diagnosis Date Noted  . Head pain 03/10/2012  . Influenza-like illness 12/09/2010  . Elbow pain, left 09/30/2010  . Shoulder pain, left 09/30/2010  . Sinusitis 09/30/2010  . Leg pain 07/09/2010  . HERPES ZOSTER 04/04/2007  . ANEMIA-IRON DEFICIENCY 11/13/2006  . DEPRESSION 11/13/2006  . HYPERTENSION 11/13/2006  . GERD 11/13/2006    Past Surgical History:  Procedure Laterality Date  . BTL reversal  2006  . CESAREAN SECTION  2007  . LAPAROSCOPY FOR ECTOPIC PREGNANCY  08/2006   left side  . LITHOTRIPSY  2004   for kidney stones  . [place]  . miscarriage  09/2006  . TONSILLECTOMY  1979  . TUBAL LIGATION  1995   BTL  . TYMPANOSTOMY TUBE PLACEMENT     2 sets of ear tubes    Prior to Admission medications   Medication Sig Start Date End Date Taking? Authorizing  Provider  acetaminophen (TYLENOL) 500 MG tablet Take 1,000 mg by mouth every 6 (six) hours as needed for mild pain, moderate pain, fever or headache.     Historical Provider, MD  fluticasone (FLONASE) 50 MCG/ACT nasal spray Place 2 sprays into the nose daily. 09/30/10 09/30/11  Ria Bush, MD  ketorolac (TORADOL) 10 MG tablet Take 1 tablet (10 mg total) by mouth every 6 (six) hours as needed. 06/06/15   Sable Feil, PA-C  ondansetron (ZOFRAN) 4 MG tablet Take 1 tablet (4 mg total) by mouth daily as needed for nausea or vomiting. 10/28/14   Nance Pear, MD  ondansetron (ZOFRAN) 8 MG tablet Take 1 tablet (8 mg total) by mouth every 8 (eight) hours as needed for nausea or vomiting. 06/06/15   Sable Feil, PA-C    Allergies  Allergen Reactions  . Oxycodone-Acetaminophen     REACTION: Hallucinations    Family History  Problem Relation Age of Onset  . Diabetes Father   . Cancer Paternal Uncle     lung CA  . Alzheimer's disease Maternal Grandmother   . Heart disease Paternal Grandfather 78    MI  . Breast cancer Maternal Aunt 52    Social History Social History  Substance Use Topics  . Smoking status: Never Smoker  . Smokeless tobacco: Never Used  . Alcohol use No    Review of Systems Constitutional: Negative for fever. Cardiovascular: Negative for chest  pain. Respiratory: Negative for shortness of breath. Gastrointestinal: Negative for abdominal pain Genitourinary: Negative for dysuria.Positive for vaginal bleeding. Neurological: Negative for headache 10-point ROS otherwise negative.  ____________________________________________   PHYSICAL EXAM:  VITAL SIGNS: ED Triage Vitals  Enc Vitals Group     BP 02/16/16 1836 (!) 145/69     Pulse Rate 02/16/16 1836 89     Resp 02/16/16 1836 18     Temp 02/16/16 1836 98.3 F (36.8 C)     Temp Source 02/16/16 1836 Oral     SpO2 02/16/16 1836 100 %     Weight 02/16/16 1837 148 lb (67.1 kg)     Height 02/16/16 1837 5'  4" (1.626 m)     Head Circumference --      Peak Flow --      Pain Score 02/16/16 1837 0     Pain Loc --      Pain Edu? --      Excl. in [place]? --     Constitutional: Alert and oriented. Well appearing and in no distress. Eyes: Normal exam ENT   Head: Normocephalic and atraumatic   Mouth/Throat: Mucous membranes are moist. Cardiovascular: Normal rate, regular rhythm. No murmur Respiratory: Normal respiratory effort without tachypnea nor retractions. Clear breath sounds. Gastrointestinal: Soft and nontender. No distention.   Musculoskeletal: Nontender with normal range of motion in all extremities.  Neurologic:  Normal speech and language. No gross focal neurologic deficits  Skin:  Skin is warm, dry and intact.  Psychiatric: Mood and affect are normal.   ____________________________________________    EKG  EKG reviewed and interpreted by myself shows normal sinus rhythm at 85 bpm, narrow QRS, normal axis, normal intervals, no ST changes.  ____________________________________________    INITIAL IMPRESSION / ASSESSMENT AND PLAN / ED COURSE  Pertinent labs & imaging results that were available during my care of the patient were reviewed by me and considered in my medical decision making (see chart for details).  Patient presents to the emergency department with vaginal bleeding and possible syncope versus near-syncope episode. Patient states she began with vaginal bleeding today after not having a period for 10 months. Patient passed one large clot felt like she had to push to get the clot come out and then became lightheaded and possibly had a syncope versus near-syncope episode. Patient now states she is feeling well. States she came to the emergency department because she is having miscarriage before and thought that the large clot could be a miscarriage. Patient states she is perimenopausal and currently seeing Dr. Star Age of OB/GYN. Overall the patient appears very well,  normal physical examination, nontender abdomen, labs are within normal limits. Vitals are within normal limits. We will IV hydrate in the emergency department. Patient will be discharged home with OB/GYN follow-up. I discussed return precautions with the patient.  ____________________________________________   FINAL CLINICAL IMPRESSION(S) / ED DIAGNOSES  Syncope Dysfunction uterine bleeding    Harvest Dark, MD 02/16/16 2158

## 2016-02-16 NOTE — ED Notes (Signed)
Pt stating that she is "passing large amounts of blood clots." Pt stating that the bleeding has decreased from earlier. Pt stating that she was on the toilet earlier and she had a syncopal episode. Pt stating that her husband was able to keep her from hitting herself or hurting herself. Pt stating that she had a lot of pressure in her pelvis and before she passed all the clots and the pain stopped. Pt stating she passed a "massive" amount of clots 5 am and at 7 am , and now she is just bleeding like a regular cycle. Pt stating she just "feels so weak." Pt's skin color WNL . Pt denying any dizziness or lightheadedness.

## 2016-02-16 NOTE — Discharge Instructions (Signed)
Please follow-up with OB/GYN for further workup and evaluation. Please use Tylenol or ibuprofen as needed for any discomfort as written on the box. Please drink plenty of fluids. Return to the emergency department for any abdominal pain, if he passed out or feel like you going to pass out, or any other symptom personally concerning to yourself.

## 2016-02-26 DIAGNOSIS — N950 Postmenopausal bleeding: Secondary | ICD-10-CM | POA: Diagnosis not present

## 2016-02-26 DIAGNOSIS — E039 Hypothyroidism, unspecified: Secondary | ICD-10-CM | POA: Diagnosis not present

## 2016-02-26 DIAGNOSIS — Z01419 Encounter for gynecological examination (general) (routine) without abnormal findings: Secondary | ICD-10-CM | POA: Diagnosis not present

## 2016-03-04 DIAGNOSIS — N83201 Unspecified ovarian cyst, right side: Secondary | ICD-10-CM | POA: Diagnosis not present

## 2016-03-04 DIAGNOSIS — N888 Other specified noninflammatory disorders of cervix uteri: Secondary | ICD-10-CM | POA: Diagnosis not present

## 2016-03-04 DIAGNOSIS — N858 Other specified noninflammatory disorders of uterus: Secondary | ICD-10-CM | POA: Diagnosis not present

## 2016-03-04 DIAGNOSIS — N950 Postmenopausal bleeding: Secondary | ICD-10-CM | POA: Diagnosis not present

## 2016-03-04 DIAGNOSIS — N83291 Other ovarian cyst, right side: Secondary | ICD-10-CM | POA: Diagnosis not present

## 2016-03-05 DIAGNOSIS — R935 Abnormal findings on diagnostic imaging of other abdominal regions, including retroperitoneum: Secondary | ICD-10-CM | POA: Diagnosis not present

## 2016-10-07 ENCOUNTER — Ambulatory Visit (INDEPENDENT_AMBULATORY_CARE_PROVIDER_SITE_OTHER): Payer: BLUE CROSS/BLUE SHIELD | Admitting: Obstetrics and Gynecology

## 2016-10-07 ENCOUNTER — Encounter: Payer: Self-pay | Admitting: Obstetrics and Gynecology

## 2016-10-07 VITALS — BP 100/62 | HR 87 | Ht 64.0 in | Wt 160.0 lb

## 2016-10-07 DIAGNOSIS — N951 Menopausal and female climacteric states: Secondary | ICD-10-CM

## 2016-10-07 MED ORDER — GABAPENTIN 300 MG PO CAPS: 300.0000 mg | ORAL_CAPSULE | Freq: Every day | ORAL | 6 refills | Status: DC

## 2016-10-07 NOTE — Patient Instructions (Signed)
Phentermine

## 2016-10-07 NOTE — Progress Notes (Signed)
Obstetrics & Gynecology Office Visit   Chief Complaint:  Chief Complaint  Patient presents with  . Follow-up    menopausal S&S hot flashes/moody/weight gain    History of Present Illness: 46 year old patient presenting with worsening vasomotor symptoms.  Besides vasomotor symptoms has also noted 15lbs weight gain, irritability, increased fatigue, and memory problems. Last menses was in February of 2018.  No currently on any hormonal contraception.  Symptoms are significantly bothersome to her.       Review of Systems: 10 point review of systems negative unless otherwise noted in HPI  Past Medical History:  Past Medical History:  Diagnosis Date  . Anemia    iron deficiency  . Depression   . GERD (gastroesophageal reflux disease)   . Hypertension     Past Surgical History:  Past Surgical History:  Procedure Laterality Date  . BTL reversal  2006  . CESAREAN SECTION  2007  . LAPAROSCOPY FOR ECTOPIC PREGNANCY  08/2006   left side  . LITHOTRIPSY  2004   for kidney stones  . [place]  . miscarriage  09/2006  . TONSILLECTOMY  1979  . TUBAL LIGATION  1995   BTL  . TYMPANOSTOMY TUBE PLACEMENT     2 sets of ear tubes    Gynecologic History: No LMP recorded. Patient is not currently having periods (Reason: Perimenopausal).  Obstetric History: No obstetric history on file.  Family History:  Family History  Problem Relation Age of Onset  . Diabetes Father   . Cancer Paternal Uncle        lung CA  . Alzheimer's disease Maternal Grandmother   . Heart disease Paternal Grandfather 71       MI  . Breast cancer Maternal Aunt 52    Social History:  Social History   Social History  . Marital status: Married    Spouse name: N/A  . Number of children: N/A  . Years of education: N/A   Occupational History  . Not on file.   Social History Main Topics  . Smoking status: Never Smoker  . Smokeless tobacco: Never Used  . Alcohol use No  .  Drug use: No  . Sexual activity: Yes   Other Topics Concern  . Not on file   Social History Narrative  . No narrative on file    Allergies:  Allergies  Allergen Reactions  . Oxycodone-Acetaminophen     REACTION: Hallucinations    Medications: Prior to Admission medications   Medication Sig Start Date End Date Taking? Authorizing Provider  gabapentin (NEURONTIN) 300 MG capsule Take 1 capsule (300 mg total) by mouth at bedtime. 10/07/16   Malachy Mood, MD    Physical Exam Vitals:  Vitals:   10/07/16 1556  BP: 100/62  Pulse: 87   No LMP recorded. Patient is not currently having periods (Reason: Perimenopausal).  General: NAD HEENT: normocephalic, anicteric Pulmonary: No increased work of breathing Neurologic: Grossly intact Psychiatric: mood appropriate, affect full  Assessment: 46 y.o. perimenopausal vasomotor symptoms  Plan: Problem List Items Addressed This Visit    None    Visit Diagnoses    Perimenopausal vasomotor symptoms    -  Primary      We discussed WHI study findings in detail.  In the combined estrogen-progesterone arm breast cancer risk was increased by 1.26 (CI of 1.00 to 1.59), coronary heart disease 1.29 (CI 1.02-1.63), stroke risk 1.41 (1.07-1.85), and pulmonary embolism 2.13 (CI 1.39-3.25).  That being said the while statistically significant the actual number of cases attributable are relatively small at an addition 8 cases of breast cancer, 7 more coronary artery event, 8 more strokes, and 8 additional case of pulmonary embolism per 10,000 women.  Study was terminated because of the increased breast cancer risk, this was not seen in the progestin only arm of the study for women without an intact uterus.  In addition it is important to note that HRT also had positive or risk reducing effects, and all cause mortality between the HRT/non-HRT users is not statistically different.  Estrogen-progestin HRT decreased the relative risk of hip fracture  0.66 (CI 0.45-0.98), colorectal cancer 0.63 (0.43-0.92).  Current consensus is to limit dose to the lowest effective dose, and shortest treatment duration possible.  Breast cancer risk appeared to increase after 4 years of use.  Also important to note is that these risk refer to systemic HRT for the treatment of vasomotor symptoms, and do not apply to vaginal preperations with minimal systemic absorption and aimed at treating symptoms of vulvovaginal atrophy.    We briefly touched on findings of WHIMS trial published in 2005 which looked at women 74 year of age or older, and whether HRT was protective against the development of dementia.  The study revealed that HRT actually increased the risk for the development of dementia but was limited by looking only at patients 48 years of age and older.  The subsequent KEEPS trial  In 2012 which looked at HRT in recently postmenopausal women did not show any improvement in cognitive function for women on HRT.  However, there was also no significant cognitive declines seen in recently postmenopausal women receiving HRT as had previously been shown in the [facility] trial.   Given that the patient is likely perimenopausal, no menses in past 7 months, will continue to monitor to verify achieves 12 months of amenorrhea.  Trial of gabapentin initiated in the meantime.  A total of 20 minutes were spent in face-to-face contact with the patient during this encounter with over half of that time devoted to counseling and coordination of care.

## 2016-10-19 ENCOUNTER — Encounter: Payer: Self-pay | Admitting: Obstetrics and Gynecology

## 2016-10-21 ENCOUNTER — Encounter: Payer: Self-pay | Admitting: Obstetrics and Gynecology

## 2016-10-22 ENCOUNTER — Other Ambulatory Visit: Payer: Self-pay | Admitting: Obstetrics and Gynecology

## 2016-10-22 MED ORDER — PHENTERMINE HCL 37.5 MG PO TABS: 37.5000 mg | ORAL_TABLET | Freq: Every day | ORAL | 0 refills | Status: DC

## 2016-10-22 NOTE — Progress Notes (Signed)
Weight up to 166lbs is doing well on gabapentin would like to start phentermine which we discussed last visit

## 2016-11-05 ENCOUNTER — Other Ambulatory Visit: Payer: Self-pay | Admitting: Obstetrics and Gynecology

## 2016-11-05 DIAGNOSIS — Z1231 Encounter for screening mammogram for malignant neoplasm of breast: Secondary | ICD-10-CM

## 2016-11-18 ENCOUNTER — Ambulatory Visit
Admission: RE | Admit: 2016-11-18 | Discharge: 2016-11-18 | Disposition: A | Discharge status: Home / Self Care | Payer: BLUE CROSS/BLUE SHIELD | Source: Ambulatory Visit | Attending: Obstetrics and Gynecology | Admitting: Obstetrics and Gynecology

## 2016-11-18 ENCOUNTER — Encounter: Payer: Self-pay | Admitting: Obstetrics and Gynecology

## 2016-11-18 DIAGNOSIS — Z1231 Encounter for screening mammogram for malignant neoplasm of breast: Secondary | ICD-10-CM | POA: Diagnosis not present

## 2016-11-19 ENCOUNTER — Ambulatory Visit: Payer: BLUE CROSS/BLUE SHIELD | Admitting: Obstetrics and Gynecology

## 2016-11-19 DIAGNOSIS — L812 Freckles: Secondary | ICD-10-CM | POA: Diagnosis not present

## 2016-11-19 DIAGNOSIS — C44612 Basal cell carcinoma of skin of right upper limb, including shoulder: Secondary | ICD-10-CM | POA: Diagnosis not present

## 2016-11-19 DIAGNOSIS — L821 Other seborrheic keratosis: Secondary | ICD-10-CM | POA: Diagnosis not present

## 2016-11-19 DIAGNOSIS — C44519 Basal cell carcinoma of skin of other part of trunk: Secondary | ICD-10-CM | POA: Diagnosis not present

## 2016-11-19 DIAGNOSIS — C4491 Basal cell carcinoma of skin, unspecified: Secondary | ICD-10-CM

## 2016-11-19 DIAGNOSIS — D1801 Hemangioma of skin and subcutaneous tissue: Secondary | ICD-10-CM | POA: Diagnosis not present

## 2016-11-19 DIAGNOSIS — D229 Melanocytic nevi, unspecified: Secondary | ICD-10-CM | POA: Diagnosis not present

## 2016-11-19 DIAGNOSIS — D485 Neoplasm of uncertain behavior of skin: Secondary | ICD-10-CM | POA: Diagnosis not present

## 2016-11-19 HISTORY — DX: Basal cell carcinoma of skin, unspecified: C44.91

## 2016-11-20 DIAGNOSIS — M7541 Impingement syndrome of right shoulder: Secondary | ICD-10-CM | POA: Diagnosis not present

## 2016-12-02 NOTE — Progress Notes (Deleted)
Gynecology Office Visit  Chief Complaint: No chief complaint on file.   History of Present Illness: Patientis a 46 y.o. No obstetric history on file. female, who presents for the evaluation of the desire to lose weight. She has lost *** pounds. The patient states the following symptoms since starting her weight loss therapy: appetite suppression, energy, and weight loss.  The patient also reports no other ill effects. The patient specifically denies heart palpitations, anxiety, and insomnia.   *** Also started on gabapentin for vasomotor symptoms   Review of Systems: 10 point review of systems negative unless otherwise noted in HPI  Past Medical History:  Past Medical History:  Diagnosis Date  . Anemia    iron deficiency  . Depression   . GERD (gastroesophageal reflux disease)   . Hypertension     Past Surgical History:  Past Surgical History:  Procedure Laterality Date  . BTL reversal  2006  . CESAREAN SECTION  2007  . LAPAROSCOPY FOR ECTOPIC PREGNANCY  08/2006   left side  . LITHOTRIPSY  2004   for kidney stones  . [place]  . miscarriage  09/2006  . TONSILLECTOMY  1979  . TUBAL LIGATION  1995   BTL  . TYMPANOSTOMY TUBE PLACEMENT     2 sets of ear tubes    Gynecologic History: No LMP recorded. Patient is not currently having periods (Reason: Perimenopausal).  Obstetric History: No obstetric history on file.  Family History:  Family History  Problem Relation Age of Onset  . Diabetes Father   . Cancer Paternal Uncle        lung CA  . Alzheimer's disease Maternal Grandmother   . Heart disease Paternal Grandfather 67       MI  . Breast cancer Maternal Aunt 52    Social History:  Social History   Socioeconomic History  . Marital status: Married    Spouse name: Not on file  . Number of children: Not on file  . Years of education: Not on file  . Highest education level: Not on file  Social Needs  . Financial resource strain:  Not on file  . Food insecurity - worry: Not on file  . Food insecurity - inability: Not on file  . Transportation needs - medical: Not on file  . Transportation needs - non-medical: Not on file  Occupational History  . Not on file  Tobacco Use  . Smoking status: Never Smoker  . Smokeless tobacco: Never Used  Substance and Sexual Activity  . Alcohol use: No  . Drug use: No  . Sexual activity: Yes  Other Topics Concern  . Not on file  Social History Narrative  . Not on file    Allergies:  Allergies  Allergen Reactions  . Oxycodone-Acetaminophen     REACTION: Hallucinations    Medications: Prior to Admission medications   Medication Sig Start Date End Date Taking? Authorizing Provider  gabapentin (NEURONTIN) 300 MG capsule Take 1 capsule (300 mg total) by mouth at bedtime. 10/07/16   Malachy Mood, MD  phentermine (ADIPEX-P) 37.5 MG tablet Take 1 tablet (37.5 mg total) by mouth daily before breakfast. 10/22/16   Malachy Mood, MD    Physical Exam There were no vitals taken for this visit.*** Prior weight 160  General: NAD HEENT: normocephalic, anicteric Thyroid: no enlargement Pulmonary: no increased work of breathing Neurologic: Grossly intact Psychiatric: mood appropriate, affect full  Assessment: 46 y.o. No obstetric history on  file. No problem-specific Assessment & Plan notes found for this encounter.   Plan: Problem List Items Addressed This Visit    None      1) 1500 Calorie ADA Diet  2) Patient education given regarding appropriate lifestyle changes for weight loss including: regular physical activity, healthy coping strategies, caloric restriction and healthy eating patterns.  3) Patient will be started on weight loss medication. The risks and benefits and side effects of medication, such as Adipex (Phenteramine) ,  Tenuate (Diethylproprion), Belviq (lorcarsin), Contrave (buproprion/naltrexone), Qsymia (phentermine/topiramate), and Saxenda  (liraglutide) is discussed. The pros and cons of suppressing appetite and boosting metabolism is discussed. Risks of tolerence and addiction is discussed for selected agents discussed. Use of medicine will ne short term, such as 3-4 months at a time followed by a period of time off of the medicine to avoid these risks and side effects for Adipex, Qsymia, and Tenuate discussed. Pt to call with any negative side effects and agrees to keep follow up appts.  4) Patient to take medication, with the benefits of appetite suppression and metabolism boost d/w pt, along with the side effects and risk factors of long term use that will be avoided with our use of short bursts of therapy. Rx provided.    5) 15 minutes face-to-face; with counseling/coordination of care > 50 percent of visit related to obesity and ongoing management/treatment   6) Follow up in 4 weeks to assess response

## 2016-12-03 ENCOUNTER — Ambulatory Visit: Payer: BLUE CROSS/BLUE SHIELD | Admitting: Obstetrics and Gynecology

## 2016-12-24 DIAGNOSIS — L988 Other specified disorders of the skin and subcutaneous tissue: Secondary | ICD-10-CM | POA: Diagnosis not present

## 2016-12-24 DIAGNOSIS — C44519 Basal cell carcinoma of skin of other part of trunk: Secondary | ICD-10-CM | POA: Diagnosis not present

## 2016-12-24 DIAGNOSIS — C44612 Basal cell carcinoma of skin of right upper limb, including shoulder: Secondary | ICD-10-CM | POA: Diagnosis not present

## 2017-01-14 DIAGNOSIS — Z85828 Personal history of other malignant neoplasm of skin: Secondary | ICD-10-CM | POA: Diagnosis not present

## 2017-01-14 DIAGNOSIS — S41101D Unspecified open wound of right upper arm, subsequent encounter: Secondary | ICD-10-CM | POA: Diagnosis not present

## 2017-06-24 DIAGNOSIS — L570 Actinic keratosis: Secondary | ICD-10-CM | POA: Diagnosis not present

## 2017-06-24 DIAGNOSIS — Z1283 Encounter for screening for malignant neoplasm of skin: Secondary | ICD-10-CM | POA: Diagnosis not present

## 2017-06-24 DIAGNOSIS — D2371 Other benign neoplasm of skin of right lower limb, including hip: Secondary | ICD-10-CM | POA: Diagnosis not present

## 2017-06-24 DIAGNOSIS — Z85828 Personal history of other malignant neoplasm of skin: Secondary | ICD-10-CM | POA: Diagnosis not present

## 2017-06-24 DIAGNOSIS — D225 Melanocytic nevi of trunk: Secondary | ICD-10-CM | POA: Diagnosis not present

## 2017-08-20 DIAGNOSIS — L720 Epidermal cyst: Secondary | ICD-10-CM | POA: Diagnosis not present

## 2017-08-20 DIAGNOSIS — D485 Neoplasm of uncertain behavior of skin: Secondary | ICD-10-CM | POA: Diagnosis not present

## 2017-09-30 ENCOUNTER — Encounter: Payer: Self-pay | Admitting: Emergency Medicine

## 2017-09-30 ENCOUNTER — Emergency Department: Payer: BLUE CROSS/BLUE SHIELD

## 2017-09-30 ENCOUNTER — Emergency Department
Admission: EM | Admit: 2017-09-30 | Discharge: 2017-09-30 | Disposition: A | Discharge status: Home / Self Care | Payer: BLUE CROSS/BLUE SHIELD | Attending: Emergency Medicine | Admitting: Emergency Medicine

## 2017-09-30 ENCOUNTER — Other Ambulatory Visit: Payer: Self-pay

## 2017-09-30 DIAGNOSIS — R102 Pelvic and perineal pain: Secondary | ICD-10-CM | POA: Diagnosis not present

## 2017-09-30 DIAGNOSIS — R1032 Left lower quadrant pain: Secondary | ICD-10-CM | POA: Diagnosis not present

## 2017-09-30 DIAGNOSIS — Z79899 Other long term (current) drug therapy: Secondary | ICD-10-CM | POA: Insufficient documentation

## 2017-09-30 DIAGNOSIS — I10 Essential (primary) hypertension: Secondary | ICD-10-CM | POA: Insufficient documentation

## 2017-09-30 DIAGNOSIS — M25552 Pain in left hip: Secondary | ICD-10-CM | POA: Diagnosis not present

## 2017-09-30 DIAGNOSIS — N200 Calculus of kidney: Secondary | ICD-10-CM | POA: Diagnosis not present

## 2017-09-30 LAB — URINALYSIS, COMPLETE (UACMP) WITH MICROSCOPIC
Bacteria, UA: NONE SEEN
Bilirubin Urine: NEGATIVE
Glucose, UA: NEGATIVE mg/dL
Ketones, ur: NEGATIVE mg/dL
Leukocytes, UA: NEGATIVE
Nitrite: NEGATIVE
PH: 6 (ref 5.0–8.0)
PROTEIN: NEGATIVE mg/dL
Specific Gravity, Urine: 1.016 (ref 1.005–1.030)

## 2017-09-30 LAB — COMPREHENSIVE METABOLIC PANEL
ALBUMIN: 3.9 g/dL (ref 3.5–5.0)
ALT: 29 U/L (ref 0–44)
AST: 25 U/L (ref 15–41)
Alkaline Phosphatase: 69 U/L (ref 38–126)
Anion gap: 7 (ref 5–15)
BUN: 13 mg/dL (ref 6–20)
CHLORIDE: 105 mmol/L (ref 98–111)
CO2: 28 mmol/L (ref 22–32)
Calcium: 9.2 mg/dL (ref 8.9–10.3)
Creatinine, Ser: 0.91 mg/dL (ref 0.44–1.00)
GFR calc non Af Amer: >60 mL/min (ref >60)
Glucose, Bld: 107 mg/dL — ABNORMAL HIGH (ref 70–99)
Potassium: 3.9 mmol/L (ref 3.5–5.1)
SODIUM: 140 mmol/L (ref 135–145)
Total Bilirubin: 0.6 mg/dL (ref 0.3–1.2)
Total Protein: 7 g/dL (ref 6.5–8.1)

## 2017-09-30 LAB — CBC
HCT: 37.9 % (ref 35.0–47.0)
Hemoglobin: 13 g/dL (ref 12.0–16.0)
MCH: 31.9 pg (ref 26.0–34.0)
MCHC: 34.4 g/dL (ref 32.0–36.0)
MCV: 92.9 fL (ref 80.0–100.0)
PLATELETS: 211 10*3/uL (ref 150–440)
RBC: 4.08 MIL/uL (ref 3.80–5.20)
RDW: 12.8 % (ref 11.5–14.5)
WBC: 5.4 10*3/uL (ref 3.6–11.0)

## 2017-09-30 LAB — LIPASE, BLOOD: LIPASE: 28 U/L (ref 11–51)

## 2017-09-30 LAB — POCT PREGNANCY, URINE: PREG TEST UR: NEGATIVE

## 2017-09-30 MED ORDER — MORPHINE SULFATE (PF) 2 MG/ML IV SOLN
INTRAVENOUS | Status: AC
  Administered 2017-09-30: 4 mg via INTRAVENOUS
  Filled 2017-09-30: qty 2

## 2017-09-30 MED ORDER — ONDANSETRON HCL 4 MG/2ML IJ SOLN
4.0000 mg | Freq: Once | INTRAMUSCULAR | Status: AC | PRN
  Administered 2017-09-30: 4 mg via INTRAVENOUS
  Filled 2017-09-30: qty 2

## 2017-09-30 MED ORDER — MORPHINE SULFATE (PF) 4 MG/ML IV SOLN: 4.0000 mg | Freq: Once | INTRAVENOUS | Status: DC

## 2017-09-30 MED ORDER — KETOROLAC TROMETHAMINE 30 MG/ML IJ SOLN
30.0000 mg | Freq: Once | INTRAMUSCULAR | Status: AC
  Administered 2017-09-30: 30 mg via INTRAVENOUS
  Filled 2017-09-30: qty 1

## 2017-09-30 MED ORDER — NAPROXEN 500 MG PO TABS: 500.0000 mg | ORAL_TABLET | Freq: Two times a day (BID) | ORAL | 2 refills | Status: DC

## 2017-09-30 MED ORDER — METHOCARBAMOL 500 MG PO TABS: 500.0000 mg | ORAL_TABLET | Freq: Three times a day (TID) | ORAL | 0 refills | Status: DC

## 2017-09-30 NOTE — Progress Notes (Signed)
Chaplain was rounding and was referred by nurse due to length of stay. Chaplain offered prayer and executed prayer for healing according to God's will. Pt thanked Chaplain the work they do.    09/30/17 1100  Clinical Encounter Type  Visited With Patient  Visit Type Initial  Referral From Nurse  Spiritual Encounters  Spiritual Needs Prayer

## 2017-09-30 NOTE — ED Triage Notes (Signed)
Pt reports left sided abdominal pain since 2am. Pt reports has had some left hip pain over the last month as well and has an appointment this week but is not sure they are related. Pt describes the abd pain as sharpe in nature and intermittent worsening if she lays on it. Pt also feels nauseated.

## 2017-09-30 NOTE — ED Notes (Signed)

## 2017-09-30 NOTE — ED Provider Notes (Signed)
[facility] Emergency Department Provider Note   ____________________________________________    I have reviewed the triage vital signs and the nursing notes.   HISTORY  Chief Complaint Abdominal Pain     HPI Laura Blackburn is a 47 y.o. female who presents with complaints of abdominal pain.  Patient reports at approximately 2 AM she developed moderate to severe left flank/left lower quadrant sharp pain.  She reports she has had kidney stones in the past but this feels different.  She also reports she has had ovarian cyst in the past and this feels similar.  Mild nausea, no vomiting.  No fevers.  No dysuria.  History of an ectopic as well.  has not take anything for this.   Past Medical History:  Diagnosis Date  . Anemia    iron deficiency  . Depression   . GERD (gastroesophageal reflux disease)   . Hypertension     Patient Active Problem List   Diagnosis Date Noted  . Head pain 03/10/2012  . Influenza-like illness 12/09/2010  . Elbow pain, left 09/30/2010  . Shoulder pain, left 09/30/2010  . Sinusitis 09/30/2010  . Leg pain 07/09/2010  . HERPES ZOSTER 04/04/2007  . ANEMIA-IRON DEFICIENCY 11/13/2006  . DEPRESSION 11/13/2006  . HYPERTENSION 11/13/2006  . GERD 11/13/2006    Past Surgical History:  Procedure Laterality Date  . BTL reversal  2006  . CESAREAN SECTION  2007  . LAPAROSCOPY FOR ECTOPIC PREGNANCY  08/2006   left side  . LITHOTRIPSY  2004   for kidney stones  . [place]  . miscarriage  09/2006  . TONSILLECTOMY  1979  . TUBAL LIGATION  1995   BTL  . TYMPANOSTOMY TUBE PLACEMENT     2 sets of ear tubes    Prior to Admission medications   Medication Sig Start Date End Date Taking? Authorizing Provider  gabapentin (NEURONTIN) 300 MG capsule Take 1 capsule (300 mg total) by mouth at bedtime. 10/07/16   Malachy Mood, MD  methocarbamol (ROBAXIN) 500 MG tablet Take 1 tablet (500 mg total) by  mouth 3 (three) times daily. 09/30/17   Lavonia Drafts, MD  naproxen (NAPROSYN) 500 MG tablet Take 1 tablet (500 mg total) by mouth 2 (two) times daily with a meal. 09/30/17   Lavonia Drafts, MD  phentermine (ADIPEX-P) 37.5 MG tablet Take 1 tablet (37.5 mg total) by mouth daily before breakfast. 10/22/16   Malachy Mood, MD     Allergies Oxycodone-acetaminophen  Family History  Problem Relation Age of Onset  . Diabetes Father   . Cancer Paternal Uncle        lung CA  . Alzheimer's disease Maternal Grandmother   . Heart disease Paternal Grandfather 76       MI  . Breast cancer Maternal Aunt 52    Social History Social History   Tobacco Use  . Smoking status: Never Smoker  . Smokeless tobacco: Never Used  Substance Use Topics  . Alcohol use: No  . Drug use: No    Review of Systems  Constitutional: No fever/chills Eyes: No visual changes.  ENT: No sore throat. Cardiovascular: Denies chest pain. Respiratory: Denies shortness of breath. Gastrointestinal: As above Genitourinary: Negative for dysuria. Musculoskeletal: Negative for back pain. Skin: Negative for rash. Neurological: Negative for headaches   ____________________________________________   PHYSICAL EXAM:  VITAL SIGNS: ED Triage Vitals  Enc Vitals Group     BP --      Pulse  Rate 09/30/17 0715 85     Resp 09/30/17 0715 16     Temp 09/30/17 0715 98.2 F (36.8 C)     Temp Source 09/30/17 0715 Oral     SpO2 09/30/17 0715 99 %     Weight 09/30/17 0716 68 kg (150 lb)     Height 09/30/17 0716 1.626 m (5\' 4" )     Head Circumference --      Peak Flow --      Pain Score 09/30/17 0716 8     Pain Loc --      Pain Edu? --      Excl. in [place]? --     Constitutional: Alert and oriented.  Uncomfortable, difficult for her to hold still Eyes: Conjunctivae are normal.    Mouth/Throat: Mucous membranes are moist.    Cardiovascular: Normal rate, regular rhythm.   Good peripheral circulation. Respiratory:  Normal respiratory effort.  No retractions.  Gastrointestinal: Mild tenderness to palpation the left lower quadrant, no distention, no CVA tenderness  Musculoskeletal:   Warm and well perfused Neurologic:  Normal speech and language. No gross focal neurologic deficits are appreciated.  Skin:  Skin is warm, dry and intact. No rash noted. Psychiatric: Mood and affect are normal. Speech and behavior are normal.  ____________________________________________   LABS (all labs ordered are listed, but only abnormal results are displayed)  Labs Reviewed  COMPREHENSIVE METABOLIC PANEL - Abnormal; Notable for the following components:      Result Value   Glucose, Bld 107 (*)    All other components within normal limits  URINALYSIS, COMPLETE (UACMP) WITH MICROSCOPIC - Abnormal; Notable for the following components:   Color, Urine YELLOW (*)    APPearance HAZY (*)    Hgb urine dipstick SMALL (*)    All other components within normal limits  URINE CULTURE  LIPASE, BLOOD  CBC  POC URINE PREG, ED  POCT PREGNANCY, URINE   ____________________________________________  EKG  None ____________________________________________  RADIOLOGY  CT renal stone study negative for ureterolithiasis ____________________________________________   PROCEDURES  Procedure(s) performed: No  Procedures   Critical Care performed: No ____________________________________________   INITIAL IMPRESSION / ASSESSMENT AND PLAN / ED COURSE  Pertinent labs & imaging results that were available during my care of the patient were reviewed by me and considered in my medical decision making (see chart for details).  She presents with left lower quadrant/left flank pain.  Differential includes ureterolithiasis, diverticulitis, ovarian cyst.  Will treat with IV morphine, IV Zofran, give IV fluids, check labs including urinalysis and POCT pregnancy and obtain CT renal stone study  CT renal stone study unremarkable.   Patient still with some mild pain, given Toradol with improvement.  Ultrasound of the pelvis ordered  Ultrasound reassuring, patient continues to feel better.  She feels this may be related to chronic hip pain.  Will discharge with Ortho follow-up.  Some thickening of the bladder, will add urine culture to initial urinalysis but patient has no dysuria or frequency    ____________________________________________   FINAL CLINICAL IMPRESSION(S) / ED DIAGNOSES  Final diagnoses:  Pelvic pain  Left lower quadrant pain  Pain of left hip joint        Note:  This document was prepared using Dragon voice recognition software and may include unintentional dictation errors.   Lavonia Drafts, MD 09/30/17 [phone]

## 2017-09-30 NOTE — ED Notes (Signed)
PT in [place] 

## 2017-10-01 LAB — URINE CULTURE: Culture: <10000 — AB

## 2017-10-02 ENCOUNTER — Ambulatory Visit (INDEPENDENT_AMBULATORY_CARE_PROVIDER_SITE_OTHER): Payer: BLUE CROSS/BLUE SHIELD | Admitting: Obstetrics and Gynecology

## 2017-10-02 ENCOUNTER — Encounter: Payer: Self-pay | Admitting: Obstetrics and Gynecology

## 2017-10-02 VITALS — BP 132/78 | HR 78 | Ht 64.0 in | Wt 165.0 lb

## 2017-10-02 DIAGNOSIS — R635 Abnormal weight gain: Secondary | ICD-10-CM

## 2017-10-02 DIAGNOSIS — Z01411 Encounter for gynecological examination (general) (routine) with abnormal findings: Secondary | ICD-10-CM | POA: Diagnosis not present

## 2017-10-02 DIAGNOSIS — M25552 Pain in left hip: Secondary | ICD-10-CM | POA: Diagnosis not present

## 2017-10-02 DIAGNOSIS — Z01419 Encounter for gynecological examination (general) (routine) without abnormal findings: Secondary | ICD-10-CM

## 2017-10-02 DIAGNOSIS — N951 Menopausal and female climacteric states: Secondary | ICD-10-CM | POA: Diagnosis not present

## 2017-10-02 DIAGNOSIS — Z1231 Encounter for screening mammogram for malignant neoplasm of breast: Secondary | ICD-10-CM

## 2017-10-02 DIAGNOSIS — Z1239 Encounter for other screening for malignant neoplasm of breast: Secondary | ICD-10-CM

## 2017-10-02 MED ORDER — CONJ ESTROG-MEDROXYPROGEST ACE 0.625-5 MG PO TABS: 1.0000 | ORAL_TABLET | Freq: Every day | ORAL | 11 refills | Status: DC

## 2017-10-02 MED ORDER — PHENTERMINE HCL 37.5 MG PO TABS: 37.5000 mg | ORAL_TABLET | Freq: Every day | ORAL | 0 refills | Status: DC

## 2017-10-02 NOTE — Progress Notes (Signed)
Gynecology Annual Exam  PCP: System, Pcp Not In  Chief Complaint:  Chief Complaint  Patient presents with  . Gynecologic Exam    pain on left lower abdomen (went to ER)  . Ovarian Cyst    History of Present Illness: Patient is a 47 y.o. [id number] presents for annual exam. The patient has no complaints today.   LMP: No LMP recorded. (Menstrual status: Perimenopausal). No menses in the past 12 months  The patient is sexually active. She currently uses post menopausal status for contraception. She denies dyspareunia.  The patient does perform self breast exams.  There is no notable family history of breast or ovarian cancer in her family.  The patient wears seatbelts: yes.   The patient has regular exercise: no.    The patient denies current symptoms of depression.     Patientis a 47 y.o. [id number] female, who presents for the evaluation of weight gain. She has gained 10 pounds primarily over 12 months. The patient states the following issues have contributed to her weight problem: none identified.  The patient has no additional symptoms. The patient specifically denies memory loss, muscle weakness, excessive thirst, and polyuria. Weight related co-morbidities include none. She has tried phentermine interventions in the past with modest  Success.  The patient did have a recent trip to the ER for left lower quadrant pain.  CT as well as transvaginal ultrasound at that time were negative.  Laboratory evaluation unremarkable.  She [place] the [place] and [place] cart and rotating her hip after which she began experiencing some left hip discomfort.  Given normal imaging and labs suspect MSK origin.     Review of Systems: Review of Systems  Constitutional: Negative for chills and fever.  HENT: Negative for congestion.   Respiratory: Negative for cough and shortness of breath.   Cardiovascular: Negative for chest pain and palpitations.  Gastrointestinal: Negative for abdominal pain,  constipation, diarrhea, heartburn, nausea and vomiting.  Genitourinary: Positive for flank pain. Negative for dysuria, frequency and urgency.  Musculoskeletal: Positive for joint pain.  Skin: Negative for itching and rash.  Neurological: Negative for dizziness and headaches.  Endo/Heme/Allergies: Negative for polydipsia.  Psychiatric/Behavioral: Negative for depression.    Past Medical History:  Past Medical History:  Diagnosis Date  . Anemia    iron deficiency  . Depression 2010   Postpartum  . Ectopic pregnancy 2008  . GERD (gastroesophageal reflux disease)   . Hypertension     Past Surgical History:  Past Surgical History:  Procedure Laterality Date  . BTL reversal  2006  . CESAREAN SECTION  2007  . LAPAROSCOPY FOR ECTOPIC PREGNANCY  08/2006   left side  . LITHOTRIPSY  2004   for kidney stones  . Aristes  . miscarriage  09/2006  . TONSILLECTOMY  1979  . TUBAL LIGATION  1995   BTL  . TYMPANOSTOMY TUBE PLACEMENT     2 sets of ear tubes    Gynecologic History:  No LMP recorded. (Menstrual status: Perimenopausal). Contraception: post menopausal status Last Pap: Results were: 02/26/2016 no abnormalities  Last mammogram: 11/18/2016 Results were: Gillian Shields I  Obstetric History: [id number]  Family History:  Family History  Problem Relation Age of Onset  . Diabetes Father   . Cancer Paternal Uncle        lung CA  . Alzheimer's disease Maternal Grandmother   . Heart disease Paternal Grandfather 76       MI  .  Breast cancer Maternal Aunt 52    Social History:  Social History   Socioeconomic History  . Marital status: Married    Spouse name: Not on file  . Number of children: Not on file  . Years of education: Not on file  . Highest education level: Not on file  Occupational History  . Not on file  Social Needs  . Financial resource strain: Not on file  . Food insecurity:    Worry: Not on file    Inability: Not on file  .  Transportation needs:    Medical: Not on file    Non-medical: Not on file  Tobacco Use  . Smoking status: Never Smoker  . Smokeless tobacco: Never Used  Substance and Sexual Activity  . Alcohol use: No  . Drug use: No  . Sexual activity: Yes  Lifestyle  . Physical activity:    Days per week: Not on file    Minutes per session: Not on file  . Stress: Not on file  Relationships  . Social connections:    Talks on phone: Not on file    Gets together: Not on file    Attends religious service: Not on file    Active member of club or organization: Not on file    Attends meetings of clubs or organizations: Not on file    Relationship status: Not on file  . Intimate partner violence:    Fear of current or ex partner: Not on file    Emotionally abused: Not on file    Physically abused: Not on file    Forced sexual activity: Not on file  Other Topics Concern  . Not on file  Social History Narrative  . Not on file    Allergies:  Allergies  Allergen Reactions  . Oxycodone-Acetaminophen     REACTION: Hallucinations    Medications: Prior to Admission medications   Medication Sig Start Date End Date Taking? Authorizing Provider  gabapentin (NEURONTIN) 300 MG capsule Take 1 capsule (300 mg total) by mouth at bedtime. Patient not taking: Reported on 10/02/2017 10/07/16   Malachy Mood, MD  naproxen (NAPROSYN) 500 MG tablet Take 1 tablet (500 mg total) by mouth 2 (two) times daily with a meal. Patient not taking: Reported on 10/02/2017 09/30/17   Lavonia Drafts, MD  phentermine (ADIPEX-P) 37.5 MG tablet Take 1 tablet (37.5 mg total) by mouth daily before breakfast. Patient not taking: Reported on 10/02/2017 10/22/16   Malachy Mood, MD    Physical Exam Vitals: Blood pressure 132/78, pulse 78, height 5\' 4"  (1.626 m), weight 165 lb (74.8 kg). Body mass index is 28.32 kg/m.  General: NAD HEENT: normocephalic, anicteric Thyroid: no enlargement, no palpable nodules Pulmonary:  No increased work of breathing, CTAB Cardiovascular: RRR, distal pulses 2+ Breast: Breast symmetrical, no tenderness, no palpable nodules or masses, no skin or nipple retraction present, no nipple discharge.  No axillary or supraclavicular lymphadenopathy. Abdomen: NABS, soft, non-tender, non-distended.  Umbilicus without lesions.  No hepatomegaly, splenomegaly or masses palpable. No evidence of hernia  Genitourinary:  External: Normal external female genitalia.  Normal urethral meatus, normal Bartholin's and Skene's glands.    Vagina: Normal vaginal mucosa, no evidence of prolapse.    Cervix: Grossly normal in appearance, no bleeding  Uterus: Non-enlarged, mobile, normal contour.  No CMT  Adnexa: ovaries non-enlarged, no adnexal masses  Rectal: deferred  Lymphatic: no evidence of inguinal lymphadenopathy Extremities: no edema, erythema, or tenderness Neurologic: Grossly intact Psychiatric: mood appropriate, affect  full  Female chaperone present for pelvic and breast  portions of the physical exam  US Pelvis Transvanginal Non-ob (tv Only)  Result Date: 09/30/2017 CLINICAL DATA:  Acute onset left pelvic pain this morning. Clinical suspicion for ovarian torsion. EXAM: TRANSABDOMINAL AND TRANSVAGINAL ULTRASOUND OF PELVIS DOPPLER ULTRASOUND OF OVARIES TECHNIQUE: Both transabdominal and transvaginal ultrasound examinations of the pelvis were performed. Transabdominal technique was performed for global imaging of the pelvis including uterus, ovaries, adnexal regions, and pelvic cul-de-sac. It was necessary to proceed with endovaginal exam following the transabdominal exam to visualize the ovaries and adnexa. Color and duplex Doppler ultrasound was utilized to evaluate blood flow to the ovaries. COMPARISON:  None. FINDINGS: Uterus Measurements: 7.6 x 3.6 x 4.0 cm. No fibroids or other mass visualized. A few cervical nabothian cysts are incidentally noted. Endometrium Thickness: 4 mm.  No focal  abnormality visualized. Right ovary Measurements: 2.0 x 1.2 x 1.4 cm. Normal appearance/no adnexal mass. Left ovary Measurements: 2.2 x 1.4 x 2.1 cm. Normal appearance/no adnexal mass. Pulsed Doppler evaluation of both ovaries demonstrates normal low-resistance arterial and venous waveforms. Other findings No abnormal free fluid. IMPRESSION: Negative. No pelvic mass or other significant abnormality identified. No sonographic evidence for ovarian torsion. Electronically Signed   By: Earle Gell M.D.   On: 09/30/2017 10:34   US Pelvis Complete  Result Date: 09/30/2017 CLINICAL DATA:  Acute onset left pelvic pain this morning. Clinical suspicion for ovarian torsion. EXAM: TRANSABDOMINAL AND TRANSVAGINAL ULTRASOUND OF PELVIS DOPPLER ULTRASOUND OF OVARIES TECHNIQUE: Both transabdominal and transvaginal ultrasound examinations of the pelvis were performed. Transabdominal technique was performed for global imaging of the pelvis including uterus, ovaries, adnexal regions, and pelvic cul-de-sac. It was necessary to proceed with endovaginal exam following the transabdominal exam to visualize the ovaries and adnexa. Color and duplex Doppler ultrasound was utilized to evaluate blood flow to the ovaries. COMPARISON:  None. FINDINGS: Uterus Measurements: 7.6 x 3.6 x 4.0 cm. No fibroids or other mass visualized. A few cervical nabothian cysts are incidentally noted. Endometrium Thickness: 4 mm.  No focal abnormality visualized. Right ovary Measurements: 2.0 x 1.2 x 1.4 cm. Normal appearance/no adnexal mass. Left ovary Measurements: 2.2 x 1.4 x 2.1 cm. Normal appearance/no adnexal mass. Pulsed Doppler evaluation of both ovaries demonstrates normal low-resistance arterial and venous waveforms. Other findings No abnormal free fluid. IMPRESSION: Negative. No pelvic mass or other significant abnormality identified. No sonographic evidence for ovarian torsion. Electronically Signed   By: Earle Gell M.D.   On: 09/30/2017 10:34    US Pelvic Doppler (torsion R/o Or Mass Arterial Flow)  Result Date: 09/30/2017 CLINICAL DATA:  Acute onset left pelvic pain this morning. Clinical suspicion for ovarian torsion. EXAM: TRANSABDOMINAL AND TRANSVAGINAL ULTRASOUND OF PELVIS DOPPLER ULTRASOUND OF OVARIES TECHNIQUE: Both transabdominal and transvaginal ultrasound examinations of the pelvis were performed. Transabdominal technique was performed for global imaging of the pelvis including uterus, ovaries, adnexal regions, and pelvic cul-de-sac. It was necessary to proceed with endovaginal exam following the transabdominal exam to visualize the ovaries and adnexa. Color and duplex Doppler ultrasound was utilized to evaluate blood flow to the ovaries. COMPARISON:  None. FINDINGS: Uterus Measurements: 7.6 x 3.6 x 4.0 cm. No fibroids or other mass visualized. A few cervical nabothian cysts are incidentally noted. Endometrium Thickness: 4 mm.  No focal abnormality visualized. Right ovary Measurements: 2.0 x 1.2 x 1.4 cm. Normal appearance/no adnexal mass. Left ovary Measurements: 2.2 x 1.4 x 2.1 cm. Normal appearance/no adnexal mass. Pulsed Doppler evaluation  of both ovaries demonstrates normal low-resistance arterial and venous waveforms. Other findings No abnormal free fluid. IMPRESSION: Negative. No pelvic mass or other significant abnormality identified. No sonographic evidence for ovarian torsion. Electronically Signed   By: Earle Gell M.D.   On: 09/30/2017 10:34   Ct Renal Stone Study  Addendum Date: 09/30/2017   ADDENDUM REPORT: 09/30/2017 08:42 ADDENDUM: Add to IMPRESSION: Mild urinary bladder wall thickening raises concern for a degree of cystitis. Sentence in the impression should read: No abscess in the abdomen or pelvis. Electronically Signed   By: Lowella Grip III M.D.   On: 09/30/2017 08:42   Result Date: 09/30/2017 CLINICAL DATA:  Left-sided abdominal pain EXAM: CT ABDOMEN AND PELVIS WITHOUT CONTRAST TECHNIQUE: Multidetector CT  imaging of the abdomen and pelvis was performed following the standard protocol without oral or IV contrast. COMPARISON:  July 22, 2006 FINDINGS: Lower chest: Lung bases are clear.  There is a focal hiatal hernia. Hepatobiliary: No focal liver lesions are appreciable on this noncontrast enhanced study. Gallbladder wall is not appreciably thickened. There is no biliary duct dilatation. Pancreas: There is no evident pancreatic mass or inflammatory focus. Spleen: No splenic lesions are evident. Adrenals/Urinary Tract: Adrenals bilaterally appear unremarkable. There is a 1.2 x 1.2 cm cyst arising from the posterior upper to mid right kidney. There is no appreciable hydronephrosis on either side. On the right, there is a 1 mm calculus in the lower pole region. On the left, there are occasional 1 mm calculi as well as a 2 mm calculus in the mid left kidney. There is no evident ureteral calculus on either side. Urinary bladder is midline. There is mild urinary bladder wall thickening. Stomach/Bowel: There is no appreciable bowel wall or mesenteric thickening. There is moderate stool in the colon and rectum. There is no evident bowel obstruction. No free air or portal venous air. Vascular/Lymphatic: There is no abdominal aortic aneurysm. No vascular lesions are evident on this noncontrast enhanced study. There is no adenopathy in the abdomen or pelvis. Reproductive: Uterus is anteverted. There is no evident pelvic mass. Other: Appendix appears normal. There is no abscess or ascites in the abdomen or pelvis. There is mild periumbilical scarring. Musculoskeletal: There are no blastic or lytic bone lesions. There is no intramuscular lesion. IMPRESSION: 1. Small intrarenal calculi bilaterally. No hydronephrosis or ureteral calculus on either side. 2. No bowel obstruction. No abscess in the abdomen pelvis. Appendix appears normal. 3.  Focal hiatal hernia. 4.  Periumbilical scarring, likely of postoperative etiology.  Electronically Signed: By: Lowella Grip III M.D. On: 09/30/2017 08:25     Assessment: 47 y.o. [id number] routine annual exam  Plan: Problem List Items Addressed This Visit    None    Visit Diagnoses    Encounter for gynecological examination without abnormal finding    -  Primary   Relevant Orders   MM 3D SCREEN BREAST BILATERAL   Breast screening       Relevant Orders   MM 3D SCREEN BREAST BILATERAL   Left hip pain       Relevant Orders   AMB referral to orthopedics     Annual Exam 1) Mammogram - recommend yearly screening mammogram.  Mammogram Is up to date  - ordered for November   2) STI screening  was notoffered and therefore not obtained  3) ASCCP guidelines and rational discussed.  Patient opts for every 3 years screening interval  4) Contraception - the patient is currently using  post  menopausal status.  She is not currently in need of contraception secondary to being sterile  5) Colonoscopy -- Screening recommended starting at age 56 for average risk individuals, age 56 for individuals deemed at increased risk (including African Americans) and recommended to continue until age 56.  For patient age 34-85 individualized approach is recommended.  Gold standard screening is via colonoscopy, Cologuard screening is an acceptable alternative for patient unwilling or unable to undergo colonoscopy.  "Colorectal cancer screening for average?risk adults: 2018 guideline update from the [place]: A Cancer Journal for Clinicians: Jun 11, 2016   6) Routine healthcare maintenance including cholesterol, diabetes screening discussed managed by PCP  7) Return in about 4 weeks (around 10/30/2017) for medication follow up.   Weight loss 1) 1500 Calorie ADA Diet  2) Patient education given regarding appropriate lifestyle changes for weight loss including: regular physical activity, healthy coping strategies, caloric restriction and healthy eating patterns.  3)  Patient will be started on weight loss medication. The risks and benefits and side effects of medication, such as Adipex (Phenteramine) ,  Tenuate (Diethylproprion), Belviq (lorcarsin), Contrave (buproprion/naltrexone), Qsymia (phentermine/topiramate), and Saxenda (liraglutide) is discussed. The pros and cons of suppressing appetite and boosting metabolism is discussed. Risks of tolerence and addiction is discussed for selected agents discussed. Use of medicine will ne short term, such as 3-4 months at a time followed by a period of time off of the medicine to avoid these risks and side effects for Adipex, Qsymia, and Tenuate discussed. Pt to call with any negative side effects and agrees to keep follow up appts.  4) Comorbidity Screening - hypothyroidism screening, diabetes, and hyperlipidemia screening offered  5) Encouraged weekly weight monitorig to track progress and sample 1 week food diary  6) 15 minutes face-to-face; counseling/coordination of care > 50 percent of visit  7) Follow up in 4 weeks to assess response  Vasomotor Symptoms We discussed WHI study findings in detail.  In the combined estrogen-progesterone arm breast cancer risk was increased by 1.26 (CI of 1.00 to 1.59), coronary heart disease 1.29 (CI 1.02-1.63), stroke risk 1.41 (1.07-1.85), and pulmonary embolism 2.13 (CI 1.39-3.25).  That being said the while statistically significant the actual number of cases attributable are relatively small at an addition 8 cases of breast cancer, 7 more coronary artery event, 8 more strokes, and 8 additional case of pulmonary embolism per 10,000 women.  Study was terminated because of the increased breast cancer risk, this was not seen in the progestin only arm of the study for women without an intact uterus.  In addition it is important to note that HRT also had positive or risk reducing effects, and all cause mortality between the HRT/non-HRT users is not statistically different.   Estrogen-progestin HRT decreased the relative risk of hip fracture 0.66 (CI 0.45-0.98), colorectal cancer 0.63 (0.43-0.92).  Current consensus is to limit dose to the lowest effective dose, and shortest treatment duration possible.  Breast cancer risk appeared to increase after 4 years of use.  Also important to note is that these risk refer to systemic HRT for the treatment of vasomotor symptoms, and do not apply to vaginal preperations with minimal systemic absorption and aimed at treating symptoms of vulvovaginal atrophy.    We briefly touched on findings of WHIMS trial published in 2005 which looked at women 62 year of age or older, and whether HRT was protective against the development of dementia.  The study revealed that HRT actually increased the risk for  the development of dementia but was limited by looking only at patients 34 years of age and older.  The subsequent KEEPS trial  In 2012 which looked at HRT in recently postmenopausal women did not show any improvement in cognitive function for women on HRT.  However, there was also no significant cognitive declines seen in recently postmenopausal women receiving HRT as had previously been shown in the WHIMS trial.    Left Hip Pain - Orthopedics evaltion   Malachy Mood, MD, [place], [facility] 10/02/2017, 1:40 PM

## 2017-10-02 NOTE — Patient Instructions (Signed)
[facility] [place] [address]  [facility]  [address] [place] [address]  Phone: [phone]  We discussed WHI study findings in detail.  In the combined estrogen-progesterone arm breast cancer risk was increased by 1.26 (CI of 1.00 to 1.59), coronary heart disease 1.29 (CI 1.02-1.63), stroke risk 1.41 (1.07-1.85), and pulmonary embolism 2.13 (CI 1.39-3.25).  That being said the while statistically significant the actual number of cases attributable are relatively small at an addition 8 cases of breast cancer, 7 more coronary artery event, 8 more strokes, and 8 additional case of pulmonary embolism per 10,000 women.  Study was terminated because of the increased breast cancer risk, this was not seen in the progestin only arm of the study for women without an intact uterus.  In addition it is important to note that HRT also had positive or risk reducing effects, and all cause mortality between the HRT/non-HRT users is not statistically different.  Estrogen-progestin HRT decreased the relative risk of hip fracture 0.66 (CI 0.45-0.98), colorectal cancer 0.63 (0.43-0.92).  Current consensus is to limit dose to the lowest effective dose, and shortest treatment duration possible.  Breast cancer risk appeared to increase after 4 years of use.  Also important to note is that these risk refer to systemic HRT for the treatment of vasomotor symptoms, and do not apply to vaginal preperations with minimal systemic absorption and aimed at treating symptoms of vulvovaginal atrophy.    We briefly touched on findings of WHIMS trial published in 2005 which looked at women 21 year of age or older, and whether HRT was protective against the development of dementia.  The study revealed that HRT actually increased the risk for the development of dementia but was limited by looking only at patients 19 years of age and older.  The subsequent KEEPS trial  In 2012 which  looked at HRT in recently postmenopausal women did not show any improvement in cognitive function for women on HRT.  However, there was also no significant cognitive declines seen in recently postmenopausal women receiving HRT as had previously been shown in the [facility] trial.

## 2017-10-28 ENCOUNTER — Ambulatory Visit (INDEPENDENT_AMBULATORY_CARE_PROVIDER_SITE_OTHER): Payer: BLUE CROSS/BLUE SHIELD | Admitting: Obstetrics and Gynecology

## 2017-10-28 ENCOUNTER — Encounter: Payer: Self-pay | Admitting: Obstetrics and Gynecology

## 2017-10-28 VITALS — BP 128/68 | HR 119 | Ht 63.0 in | Wt 152.0 lb

## 2017-10-28 DIAGNOSIS — Z6826 Body mass index (BMI) 26.0-26.9, adult: Secondary | ICD-10-CM | POA: Diagnosis not present

## 2017-10-28 DIAGNOSIS — E663 Overweight: Secondary | ICD-10-CM

## 2017-10-28 MED ORDER — PHENTERMINE HCL 37.5 MG PO TABS: 37.5000 mg | ORAL_TABLET | Freq: Every day | ORAL | 0 refills | Status: DC

## 2017-10-28 MED ORDER — CONJ ESTROG-MEDROXYPROGEST ACE 0.625-5 MG PO TABS: 1.0000 | ORAL_TABLET | Freq: Every day | ORAL | 3 refills | Status: DC

## 2017-10-28 NOTE — Progress Notes (Signed)
Gynecology Office Visit  Chief Complaint:  Chief Complaint  Patient presents with  . weight loss check    History of Present Illness: Patientis a 47 y.o. [id number] female, who presents for the evaluation of the desire to lose weight. She has lost 13 pounds 1 months. The patient states the following symptoms since starting her weight loss therapy: appetite suppression, energy, and weight loss.  The patient also reports no other ill effects. The patient specifically denies heart palpitations, anxiety, and insomnia.    Review of Systems: 10 point review of systems negative unless otherwise noted in HPI  Past Medical History:  Past Medical History:  Diagnosis Date  . Anemia    iron deficiency  . Depression 2010   Postpartum  . Ectopic pregnancy 2008  . GERD (gastroesophageal reflux disease)   . Hypertension     Past Surgical History:  Past Surgical History:  Procedure Laterality Date  . BTL reversal  2006  . CESAREAN SECTION  2007  . LAPAROSCOPY FOR ECTOPIC PREGNANCY  08/2006   left side  . LITHOTRIPSY  2004   for kidney stones  . [place]  . miscarriage  09/2006  . TONSILLECTOMY  1979  . TUBAL LIGATION  1995   BTL  . TYMPANOSTOMY TUBE PLACEMENT     2 sets of ear tubes    Gynecologic History: No LMP recorded. (Menstrual status: Perimenopausal).  Obstetric History: [id number]  Family History:  Family History  Problem Relation Age of Onset  . Diabetes Father   . Cancer Paternal Uncle        lung CA  . Alzheimer's disease Maternal Grandmother   . Heart disease Paternal Grandfather 57       MI  . Breast cancer Maternal Aunt 52    Social History:  Social History   Socioeconomic History  . Marital status: Married    Spouse name: Not on file  . Number of children: Not on file  . Years of education: Not on file  . Highest education level: Not on file  Occupational History  . Not on file  Social Needs  . Financial resource strain:  Not on file  . Food insecurity:    Worry: Not on file    Inability: Not on file  . Transportation needs:    Medical: Not on file    Non-medical: Not on file  Tobacco Use  . Smoking status: Never Smoker  . Smokeless tobacco: Never Used  Substance and Sexual Activity  . Alcohol use: No  . Drug use: No  . Sexual activity: Yes  Lifestyle  . Physical activity:    Days per week: Not on file    Minutes per session: Not on file  . Stress: Not on file  Relationships  . Social connections:    Talks on phone: Not on file    Gets together: Not on file    Attends religious service: Not on file    Active member of club or organization: Not on file    Attends meetings of clubs or organizations: Not on file    Relationship status: Not on file  . Intimate partner violence:    Fear of current or ex partner: Not on file    Emotionally abused: Not on file    Physically abused: Not on file    Forced sexual activity: Not on file  Other Topics Concern  . Not on file  Social History Narrative  .  Not on file    Allergies:  Allergies  Allergen Reactions  . Oxycodone-Acetaminophen     REACTION: Hallucinations    Medications: Prior to Admission medications   Medication Sig Start Date End Date Taking? Authorizing Provider  estrogen, conjugated,-medroxyprogesterone (PREMPRO) 0.625-5 MG tablet Take 1 tablet by mouth daily. 10/02/17  Yes Malachy Mood, MD  gabapentin (NEURONTIN) 300 MG capsule Take 1 capsule (300 mg total) by mouth at bedtime. 10/07/16  Yes Malachy Mood, MD  naproxen (NAPROSYN) 500 MG tablet Take 1 tablet (500 mg total) by mouth 2 (two) times daily with a meal. 09/30/17  Yes Lavonia Drafts, MD  phentermine (ADIPEX-P) 37.5 MG tablet Take 1 tablet (37.5 mg total) by mouth daily before breakfast. 10/02/17  Yes Malachy Mood, MD    Physical Exam Blood pressure 128/68, pulse (!) 119, height 5\' 3"  (1.6 m), weight 152 lb (68.9 kg). Wt Readings from Last 3 Encounters:    10/28/17 152 lb (68.9 kg)  10/02/17 165 lb (74.8 kg)  09/30/17 150 lb (68 kg)  Body mass index is 26.93 kg/m.   General: NAD HEENT: normocephalic, anicteric Thyroid: no enlargement Pulmonary: no increased work of breathing Neurologic: Grossly intact Psychiatric: mood appropriate, affect full  Assessment: 47 y.o. [id number] follow up medical weight loss management  Plan: Problem List Items Addressed This Visit    None    Visit Diagnoses    Overweight (BMI 25.0-29.9)    -  Primary   BMI 26.0-26.9,adult          1) 1500 Calorie ADA Diet  2) Patient education given regarding appropriate lifestyle changes for weight loss including: regular physical activity, healthy coping strategies, caloric restriction and healthy eating patterns.  3) Patient will be started on weight loss medication. The risks and benefits and side effects of medication, such as Adipex (Phenteramine) ,  Tenuate (Diethylproprion), Belviq (lorcarsin), Contrave (buproprion/naltrexone), Qsymia (phentermine/topiramate), and Saxenda (liraglutide) is discussed. The pros and cons of suppressing appetite and boosting metabolism is discussed. Risks of tolerence and addiction is discussed for selected agents discussed. Use of medicine will ne short term, such as 3-4 months at a time followed by a period of time off of the medicine to avoid these risks and side effects for Adipex, Qsymia, and Tenuate discussed. Pt to call with any negative side effects and agrees to keep follow up appts.  4) Patient to take medication, with the benefits of appetite suppression and metabolism boost d/w pt, along with the side effects and risk factors of long term use that will be avoided with our use of short bursts of therapy. Rx provided.    5) 15 minutes face-to-face; with counseling/coordination of care > 50 percent of visit related to obesity and ongoing management/treatment   6)  Return in about 4 weeks (around 11/25/2017) for medication  follow up.    Malachy Mood, MD, [facility], [facility] 10/28/2017, 4:07 PM

## 2017-11-02 DIAGNOSIS — M25552 Pain in left hip: Secondary | ICD-10-CM | POA: Diagnosis not present

## 2017-11-02 DIAGNOSIS — M7062 Trochanteric bursitis, left hip: Secondary | ICD-10-CM | POA: Diagnosis not present

## 2017-11-27 ENCOUNTER — Encounter: Payer: Self-pay | Admitting: Obstetrics and Gynecology

## 2017-11-27 ENCOUNTER — Ambulatory Visit (INDEPENDENT_AMBULATORY_CARE_PROVIDER_SITE_OTHER): Payer: BLUE CROSS/BLUE SHIELD | Admitting: Obstetrics and Gynecology

## 2017-11-27 VITALS — BP 118/60 | HR 97 | Ht 64.0 in | Wt 150.0 lb

## 2017-11-27 DIAGNOSIS — E663 Overweight: Secondary | ICD-10-CM | POA: Diagnosis not present

## 2017-11-27 DIAGNOSIS — Z6825 Body mass index (BMI) 25.0-25.9, adult: Secondary | ICD-10-CM | POA: Diagnosis not present

## 2017-11-27 MED ORDER — PHENTERMINE HCL 37.5 MG PO TABS: 37.5000 mg | ORAL_TABLET | Freq: Every day | ORAL | 0 refills | Status: DC

## 2017-11-27 NOTE — Progress Notes (Signed)
Gynecology Office Visit  Chief Complaint:  Chief Complaint  Patient presents with  . Follow-up    Weight loss    History of Present Illness: Patientis a 47 y.o. [id number] female, who presents for the evaluation of the desire to lose weight. She has lost 2 pounds 1 months, 15lbs total over 2 months. The patient states the following symptoms since starting her weight loss therapy: appetite suppression, energy, and weight loss.  The patient also reports no other ill effects. The patient specifically denies heart palpitations, anxiety, and insomnia.    Review of Systems: 10 point review of systems negative unless otherwise noted in HPI  Past Medical History:  Past Medical History:  Diagnosis Date  . Anemia    iron deficiency  . Depression 2010   Postpartum  . Ectopic pregnancy 2008  . GERD (gastroesophageal reflux disease)   . Hypertension     Past Surgical History:  Past Surgical History:  Procedure Laterality Date  . BTL reversal  2006  . CESAREAN SECTION  2007  . LAPAROSCOPY FOR ECTOPIC PREGNANCY  08/2006   left side  . LITHOTRIPSY  2004   for kidney stones  . [place]  . miscarriage  09/2006  . TONSILLECTOMY  1979  . TUBAL LIGATION  1995   BTL  . TYMPANOSTOMY TUBE PLACEMENT     2 sets of ear tubes    Gynecologic History: No LMP recorded. (Menstrual status: Perimenopausal).  Obstetric History: [id number]  Family History:  Family History  Problem Relation Age of Onset  . Diabetes Father   . Cancer Paternal Uncle        lung CA  . Alzheimer's disease Maternal Grandmother   . Heart disease Paternal Grandfather 77       MI  . Breast cancer Maternal Aunt 52    Social History:  Social History   Socioeconomic History  . Marital status: Married    Spouse name: Not on file  . Number of children: Not on file  . Years of education: Not on file  . Highest education level: Not on file  Occupational History  . Not on file  Social Needs   . Financial resource strain: Not on file  . Food insecurity:    Worry: Not on file    Inability: Not on file  . Transportation needs:    Medical: Not on file    Non-medical: Not on file  Tobacco Use  . Smoking status: Never Smoker  . Smokeless tobacco: Never Used  Substance and Sexual Activity  . Alcohol use: No  . Drug use: No  . Sexual activity: Yes  Lifestyle  . Physical activity:    Days per week: Not on file    Minutes per session: Not on file  . Stress: Not on file  Relationships  . Social connections:    Talks on phone: Not on file    Gets together: Not on file    Attends religious service: Not on file    Active member of club or organization: Not on file    Attends meetings of clubs or organizations: Not on file    Relationship status: Not on file  . Intimate partner violence:    Fear of current or ex partner: Not on file    Emotionally abused: Not on file    Physically abused: Not on file    Forced sexual activity: Not on file  Other Topics Concern  . Not  on file  Social History Narrative  . Not on file    Allergies:  Allergies  Allergen Reactions  . Oxycodone-Acetaminophen     REACTION: Hallucinations    Medications: Prior to Admission medications   Medication Sig Start Date End Date Taking? Authorizing Provider  estrogen, conjugated,-medroxyprogesterone (PREMPRO) 0.625-5 MG tablet Take 1 tablet by mouth daily. 10/28/17   Malachy Mood, MD  gabapentin (NEURONTIN) 300 MG capsule Take 1 capsule (300 mg total) by mouth at bedtime. 10/07/16   Malachy Mood, MD  naproxen (NAPROSYN) 500 MG tablet Take 1 tablet (500 mg total) by mouth 2 (two) times daily with a meal. 09/30/17   Lavonia Drafts, MD  phentermine (ADIPEX-P) 37.5 MG tablet Take 1 tablet (37.5 mg total) by mouth daily before breakfast. 10/28/17   Malachy Mood, MD    Physical Exam Blood pressure 118/60, pulse 97, height 5\' 4"  (1.626 m), weight 150 lb (68 kg). Wt Readings from Last 3  Encounters:  11/27/17 150 lb (68 kg)  10/28/17 152 lb (68.9 kg)  10/02/17 165 lb (74.8 kg)  2lbs Body mass index is 25.75 kg/m.  General: NAD HEENT: normocephalic, anicteric Thyroid: no enlargement Pulmonary: no increased work of breathing Neurologic: Grossly intact Psychiatric: mood appropriate, affect full  Assessment: 47 y.o. [id number] No problem-specific Assessment & Plan notes found for this encounter.   Plan: Problem List Items Addressed This Visit    None      1) 1500 Calorie ADA Diet  2) Patient education given regarding appropriate lifestyle changes for weight loss including: regular physical activity, healthy coping strategies, caloric restriction and healthy eating patterns.  3) Patient will be started on weight loss medication. The risks and benefits and side effects of medication, such as Adipex (Phenteramine) ,  Tenuate (Diethylproprion), Belviq (lorcarsin), Contrave (buproprion/naltrexone), Qsymia (phentermine/topiramate), and Saxenda (liraglutide) is discussed. The pros and cons of suppressing appetite and boosting metabolism is discussed. Risks of tolerence and addiction is discussed for selected agents discussed. Use of medicine will ne short term, such as 3-4 months at a time followed by a period of time off of the medicine to avoid these risks and side effects for Adipex, Qsymia, and Tenuate discussed. Pt to call with any negative side effects and agrees to keep follow up appts.  4) Patient to take medication, with the benefits of appetite suppression and metabolism boost d/w pt, along with the side effects and risk factors of long term use that will be avoided with our use of short bursts of therapy. Rx provided.    5) 15 minutes face-to-face; with counseling/coordination of care > 50 percent of visit related to obesity and ongoing management/treatment   6)  Return in about 7 months (around 06/28/2018) for annual.    Malachy Mood, MD, [place], [place] 11/27/2017, 5:05 PM

## 2018-01-07 ENCOUNTER — Ambulatory Visit
Admission: RE | Admit: 2018-01-07 | Discharge: 2018-01-07 | Disposition: A | Discharge status: Home / Self Care | Payer: BLUE CROSS/BLUE SHIELD | Source: Ambulatory Visit | Attending: Obstetrics and Gynecology | Admitting: Obstetrics and Gynecology

## 2018-01-07 DIAGNOSIS — Z1239 Encounter for other screening for malignant neoplasm of breast: Secondary | ICD-10-CM | POA: Insufficient documentation

## 2018-01-07 DIAGNOSIS — Z1231 Encounter for screening mammogram for malignant neoplasm of breast: Secondary | ICD-10-CM | POA: Diagnosis not present

## 2018-01-07 DIAGNOSIS — Z01419 Encounter for gynecological examination (general) (routine) without abnormal findings: Secondary | ICD-10-CM | POA: Diagnosis not present

## 2018-01-13 HISTORY — PX: ROTATOR CUFF REPAIR: SHX139

## 2018-02-05 DIAGNOSIS — J209 Acute bronchitis, unspecified: Secondary | ICD-10-CM | POA: Diagnosis not present

## 2018-04-12 DIAGNOSIS — M7918 Myalgia, other site: Secondary | ICD-10-CM | POA: Diagnosis not present

## 2018-04-12 DIAGNOSIS — M4602 Spinal enthesopathy, cervical region: Secondary | ICD-10-CM | POA: Diagnosis not present

## 2018-04-12 DIAGNOSIS — M9901 Segmental and somatic dysfunction of cervical region: Secondary | ICD-10-CM | POA: Diagnosis not present

## 2018-04-18 NOTE — Telephone Encounter (Signed)
Phone visit sometime this week

## 2018-04-22 ENCOUNTER — Ambulatory Visit (INDEPENDENT_AMBULATORY_CARE_PROVIDER_SITE_OTHER): Payer: BLUE CROSS/BLUE SHIELD | Admitting: Obstetrics and Gynecology

## 2018-04-22 ENCOUNTER — Ambulatory Visit: Payer: BLUE CROSS/BLUE SHIELD | Admitting: Obstetrics and Gynecology

## 2018-04-22 ENCOUNTER — Other Ambulatory Visit: Payer: Self-pay

## 2018-04-22 ENCOUNTER — Encounter: Payer: Self-pay | Admitting: Obstetrics and Gynecology

## 2018-04-22 VITALS — BP 140/82 | HR 78 | Ht 64.0 in | Wt 133.0 lb

## 2018-04-22 DIAGNOSIS — F411 Generalized anxiety disorder: Secondary | ICD-10-CM

## 2018-04-22 DIAGNOSIS — R002 Palpitations: Secondary | ICD-10-CM | POA: Diagnosis not present

## 2018-04-22 MED ORDER — HYDROXYZINE HCL 25 MG PO TABS: 25.0000 mg | ORAL_TABLET | Freq: Four times a day (QID) | ORAL | 2 refills | Status: DC | PRN

## 2018-04-22 MED ORDER — ESCITALOPRAM OXALATE 10 MG PO TABS: 10.0000 mg | ORAL_TABLET | Freq: Every day | ORAL | 3 refills | Status: DC

## 2018-04-22 NOTE — Progress Notes (Signed)
Obstetrics & Gynecology Office Visit   Chief Complaint:  Chief Complaint  Patient presents with  . Follow-up    Medication follow up Anxiety  . Tachycardia    History of Present Illness: The patient is a 48 y.o. female presenting initial evaluation for symptoms of anxiety, palpitations.  The patient is currently taking nothing for the management of her symptoms.  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  She reports symptoms of anhedonia, insomnia, irritability and social anxiety.  She denies feelings of worthlessness, suicidal ideation, homicidal ideation, auditory hallucinations and visual hallucinations.   The patient does have a pre-existing history of depression and anxiety.  She  does not a prior history of suicide attempts.   Palpitation and heart racing has woken her from sleep.  No chest pain, orthopnea, or paroxysmal nocturnal dyspnea.  No cardiac history other than an episode of asystole with surgery with full work up at that time negative.  Review of Systems: Review of Systems  Constitutional: Negative.   Cardiovascular: Positive for palpitations. Negative for chest pain, orthopnea, claudication, leg swelling and PND.  Psychiatric/Behavioral: Negative for depression, hallucinations, memory loss, substance abuse and suicidal ideas. The patient is nervous/anxious and has insomnia.     Past Medical History:  Past Medical History:  Diagnosis Date  . Anemia    iron deficiency  . Depression 2010   Postpartum  . Ectopic pregnancy 2008  . GERD (gastroesophageal reflux disease)   . Hypertension     Past Surgical History:  Past Surgical History:  Procedure Laterality Date  . BTL reversal  2006  . CESAREAN SECTION  2007  . LAPAROSCOPY FOR ECTOPIC PREGNANCY  08/2006   left side  . LITHOTRIPSY  2004   for kidney stones  . [place]  . miscarriage  09/2006  . TONSILLECTOMY  1979  . TUBAL  LIGATION  1995   BTL  . TYMPANOSTOMY TUBE PLACEMENT     2 sets of ear tubes    Gynecologic History: No LMP recorded. (Menstrual status: Perimenopausal).  Obstetric History: [id number]  Family History:  Family History  Problem Relation Age of Onset  . Diabetes Father   . Cancer Paternal Uncle        lung CA  . Alzheimer's disease Maternal Grandmother   . Heart disease Paternal Grandfather 38       MI  . Breast cancer Maternal Aunt 52    Social History:  Social History   Socioeconomic History  . Marital status: Married    Spouse name: Not on file  . Number of children: Not on file  . Years of education: Not on file  . Highest education level: Not on file  Occupational History  . Not on file  Social Needs  . Financial resource strain: Not on file  . Food insecurity:    Worry: Not on file    Inability: Not on file  . Transportation needs:    Medical: Not on file    Non-medical: Not on file  Tobacco Use  . Smoking status: Never Smoker  . Smokeless tobacco: Never Used  Substance and Sexual Activity  . Alcohol use: No  . Drug use: No  . Sexual activity: Yes  Lifestyle  . Physical activity:    Days per week: Not on file    Minutes per session: Not on file  . Stress: Not on file  Relationships  . Social connections:  Talks on phone: Not on file    Gets together: Not on file    Attends religious service: Not on file    Active member of club or organization: Not on file    Attends meetings of clubs or organizations: Not on file    Relationship status: Not on file  . Intimate partner violence:    Fear of current or ex partner: Not on file    Emotionally abused: Not on file    Physically abused: Not on file    Forced sexual activity: Not on file  Other Topics Concern  . Not on file  Social History Narrative  . Not on file    Allergies:  Allergies  Allergen Reactions  . Oxycodone-Acetaminophen     REACTION: Hallucinations    Medications: Prior to  Admission medications   Medication Sig Start Date End Date Taking? Authorizing Provider  estrogen, conjugated,-medroxyprogesterone (PREMPRO) 0.625-5 MG tablet Take 1 tablet by mouth daily. 10/28/17  Yes Malachy Mood, MD  gabapentin (NEURONTIN) 300 MG capsule Take 1 capsule (300 mg total) by mouth at bedtime. 10/07/16  Yes Malachy Mood, MD  naproxen (NAPROSYN) 500 MG tablet Take 1 tablet (500 mg total) by mouth 2 (two) times daily with a meal. 09/30/17  Yes Lavonia Drafts, MD    Physical Exam Vitals:  Vitals:   04/22/18 1621  BP: 140/82  Pulse: 78   No LMP recorded. (Menstrual status: Perimenopausal).  General: NAD, well nourished, appears stated age 21: normocephalic, anicteric CV: RRR Pulmonary: No increased work of breathing Neurologic: Grossly intact Psychiatric: mood appropriate, affect full    GAD 7 : Generalized Anxiety Score 04/22/2018  Nervous, Anxious, on Edge 2  Control/stop worrying 3  Worry too much - different things 3  Trouble relaxing 2  Restless 2  Easily annoyed or irritable 3  Afraid - awful might happen 3  Total GAD 7 Score 18  Anxiety Difficulty Very difficult    Assessment: 48 y.o. [id number] generalized anxiety  Plan: Problem List Items Addressed This Visit    None    Visit Diagnoses    Generalized anxiety disorder    -  Primary   Relevant Orders   Thyroid Profile   CBC   B12   Palpitation       Relevant Orders   Thyroid Profile   CBC      1) Generalized anxiety disorder - start lexapro 10mg  po daily - Vistaril 25mg  q6hrs prn  2) Thyroid and B12 screen was obtained today  3)  Return in about 2 weeks (around 05/06/2018) for Medication follow up (Telephone).    Malachy Mood, MD, [facility], [facility] 04/22/2018, 4:32 PM

## 2018-04-23 LAB — CBC
Hematocrit: 39.5 % (ref 34.0–46.6)
Hemoglobin: 13.3 g/dL (ref 11.1–15.9)
MCH: 30.7 pg (ref 26.6–33.0)
MCHC: 33.7 g/dL (ref 31.5–35.7)
MCV: 91 fL (ref 79–97)
Platelets: 224 10*3/uL (ref 150–450)
RBC: 4.33 x10E6/uL (ref 3.77–5.28)
RDW: 11.8 % (ref 11.7–15.4)
WBC: 6.1 10*3/uL (ref 3.4–10.8)

## 2018-04-23 LAB — THYROID PANEL
Free Thyroxine Index: 1.6 (ref 1.2–4.9)
T3 Uptake Ratio: 22 % — ABNORMAL LOW (ref 24–39)
T4, Total: 7.1 ug/dL (ref 4.5–12.0)

## 2018-04-23 LAB — VITAMIN B12: Vitamin B-12: 442 pg/mL (ref 232–1245)

## 2018-05-05 ENCOUNTER — Other Ambulatory Visit: Payer: Self-pay

## 2018-05-05 ENCOUNTER — Ambulatory Visit (INDEPENDENT_AMBULATORY_CARE_PROVIDER_SITE_OTHER): Payer: BLUE CROSS/BLUE SHIELD | Admitting: Obstetrics and Gynecology

## 2018-05-05 DIAGNOSIS — F411 Generalized anxiety disorder: Secondary | ICD-10-CM

## 2018-05-05 MED ORDER — GABAPENTIN 300 MG PO CAPS: 300.0000 mg | ORAL_CAPSULE | Freq: Every day | ORAL | 3 refills | Status: DC

## 2018-05-05 MED ORDER — ESCITALOPRAM OXALATE 10 MG PO TABS: 10.0000 mg | ORAL_TABLET | Freq: Every day | ORAL | 3 refills | Status: DC

## 2018-05-05 NOTE — Progress Notes (Signed)
I connected with Laura Blackburn   on 05/05/2018 at  3:30 PM EDT by telephone and verified that I am speaking with the correct person using two identifiers.   I discussed the limitations, risks, security and privacy concerns of performing an evaluation and management service by telephone and the availability of in person appointments. I also discussed with the patient that there may be a patient responsible charge related to this service. The patient expressed understanding and agreed to proceed.  The patient was at home I spoke with the patient from my workstation phone The names of people involved in this encounter were: Laura Blackburn , and [facility]   Obstetrics & Gynecology Office Visit   Chief Complaint:  Chief Complaint  Patient presents with  . Follow-up    Anxiety    History of Present Illness: The patient is a 48 y.o. female presenting follow up for symptoms of anxiety.  The patient is currently taking Lexapro 10mg  po daily for the management of her symptoms.  She has not had any recent situational stressors beyond the current COVID-19 pandemic and associated restrictions.  She did recently injure her back was seen by chiropractor.  She reports resolution of symptoms on current dose of Lexapro.  She denies anhedonia, day time somnolence, insomnia, risk taking behavior, irritability, social anxiety, agorophobia, feelings of guilt, feelings of worthlessness, suicidal ideation, homicidal ideation, auditory hallucinations and visual hallucinations. Symptoms have improved since last visit.     The patient does not have a pre-existing history of depression and anxiety.  She  does not a prior history of suicide attempts.    Review of Systems: Review of Systems  Constitutional: Negative.   Gastrointestinal: Negative for nausea and vomiting.  Neurological: Negative for headaches.  Psychiatric/Behavioral: Negative.     Past Medical History:  Past Medical History:   Diagnosis Date  . Anemia    iron deficiency  . Depression 2010   Postpartum  . Ectopic pregnancy 2008  . GERD (gastroesophageal reflux disease)   . Hypertension     Past Surgical History:  Past Surgical History:  Procedure Laterality Date  . BTL reversal  2006  . CESAREAN SECTION  2007  . LAPAROSCOPY FOR ECTOPIC PREGNANCY  08/2006   left side  . LITHOTRIPSY  2004   for kidney stones  . [place]  . miscarriage  09/2006  . TONSILLECTOMY  1979  . TUBAL LIGATION  1995   BTL  . TYMPANOSTOMY TUBE PLACEMENT     2 sets of ear tubes    Gynecologic History: No LMP recorded. (Menstrual status: Perimenopausal).  Obstetric History: [id number]  Family History:  Family History  Problem Relation Age of Onset  . Diabetes Father   . Cancer Paternal Uncle        lung CA  . Alzheimer's disease Maternal Grandmother   . Heart disease Paternal Grandfather 45       MI  . Breast cancer Maternal Aunt 52    Social History:  Social History   Socioeconomic History  . Marital status: Married    Spouse name: Not on file  . Number of children: Not on file  . Years of education: Not on file  . Highest education level: Not on file  Occupational History  . Not on file  Social Needs  . Financial resource strain: Not on file  . Food insecurity:    Worry: Not on file    Inability: Not on  file  . Transportation needs:    Medical: Not on file    Non-medical: Not on file  Tobacco Use  . Smoking status: Never Smoker  . Smokeless tobacco: Never Used  Substance and Sexual Activity  . Alcohol use: No  . Drug use: No  . Sexual activity: Yes  Lifestyle  . Physical activity:    Days per week: Not on file    Minutes per session: Not on file  . Stress: Not on file  Relationships  . Social connections:    Talks on phone: Not on file    Gets together: Not on file    Attends religious service: Not on file    Active member of club or organization: Not on file     Attends meetings of clubs or organizations: Not on file    Relationship status: Not on file  . Intimate partner violence:    Fear of current or ex partner: Not on file    Emotionally abused: Not on file    Physically abused: Not on file    Forced sexual activity: Not on file  Other Topics Concern  . Not on file  Social History Narrative  . Not on file    Allergies:  Allergies  Allergen Reactions  . Oxycodone-Acetaminophen     REACTION: Hallucinations    Medications: Prior to Admission medications   Medication Sig Start Date End Date Taking? Authorizing Provider  escitalopram (LEXAPRO) 10 MG tablet Take 1 tablet (10 mg total) by mouth daily. 04/22/18  Yes Malachy Mood, MD  estrogen, conjugated,-medroxyprogesterone (PREMPRO) 0.625-5 MG tablet Take 1 tablet by mouth daily. 10/28/17  Yes Malachy Mood, MD  hydrOXYzine (ATARAX/VISTARIL) 25 MG tablet Take 1 tablet (25 mg total) by mouth every 6 (six) hours as needed for anxiety. 04/22/18  Yes Malachy Mood, MD  gabapentin (NEURONTIN) 300 MG capsule Take 1 capsule (300 mg total) by mouth at bedtime. Patient not taking: Reported on 05/05/2018 10/07/16   Malachy Mood, MD  naproxen (NAPROSYN) 500 MG tablet Take 1 tablet (500 mg total) by mouth 2 (two) times daily with a meal. Patient not taking: Reported on 05/05/2018 09/30/17   Lavonia Drafts, MD    Physical Exam Vitals: There were no vitals filed for this visit. No LMP recorded. (Menstrual status: Perimenopausal).  No physical exam as this was a remote telephone visit to promote social distancing during the current COVID-19 Pandemic   GAD 7 : Generalized Anxiety Score 05/05/2018 04/22/2018  Nervous, Anxious, on Edge 2 2  Control/stop worrying 1 3  Worry too much - different things 1 3  Trouble relaxing 1 2  Restless 1 2  Easily annoyed or irritable 1 3  Afraid - awful might happen 1 3  Total GAD 7 Score 8 18  Anxiety Difficulty Somewhat difficult Very difficult    No  flowsheet data found.  No flowsheet data found.   Assessment: 48 y.o. [id number] follow up anxiety  Plan: Problem List Items Addressed This Visit    None    Visit Diagnoses    Generalized anxiety disorder    -  Primary   Relevant Medications   escitalopram (LEXAPRO) 10 MG tablet      1) Refill gabapentin for vasomotor symptoms, feels this has also helped with back pain  2) Refill lexapro maintain current dose of 10mg   3) Thyroid and B12 screen has been obtained previously  4) Telephone time 7 minutes 48 seconds  5) Return in about 1 year (around  05/05/2019) for annual.      Malachy Mood, MD, [place], [facility] 05/05/2018, 2:49 PM

## 2018-05-31 ENCOUNTER — Telehealth: Payer: Self-pay

## 2018-05-31 NOTE — Telephone Encounter (Signed)
Pt called c/o pain in her back between her shoulder blades which she mentioned to AMS at last visit.  Now she has pain in both arms.  It's hard to sleep; mainly hurts with lying down.  She doesn't have a primary care.  Can AMS refer her to someone or order tests?  [phone]  Courtesy call to pt that AMS isn't in office until later this week.

## 2018-06-03 ENCOUNTER — Other Ambulatory Visit: Payer: Self-pay | Admitting: Obstetrics and Gynecology

## 2018-06-03 DIAGNOSIS — M25511 Pain in right shoulder: Secondary | ICD-10-CM

## 2018-06-03 DIAGNOSIS — M25512 Pain in left shoulder: Secondary | ICD-10-CM

## 2018-06-03 NOTE — Telephone Encounter (Signed)
Please advise 

## 2018-06-03 NOTE — Telephone Encounter (Signed)
Pt aware, I will send message to [place] and she can update patient.

## 2018-06-03 NOTE — Telephone Encounter (Signed)
[facility] might be best bet if she need an immediate appointment I think they have walk in. I've sent a referral to [facility]

## 2018-06-11 NOTE — Telephone Encounter (Signed)
I contacted the patient to apologize for the delay, to let her know that the referral was faxed to [facility] this morning, and that she may want to try [facility] if she needs to be seen sooner. Patient said she already contacted [facility], but hasn't seen them as she was able to get some relief. Patient said she would f/u with Jefm Bryant, the pain is intermittent, but she would like to be seen and have it taken care of. I let the patient know to please call back if she needs anything else.

## 2018-06-22 DIAGNOSIS — M5134 Other intervertebral disc degeneration, thoracic region: Secondary | ICD-10-CM | POA: Diagnosis not present

## 2018-06-22 DIAGNOSIS — M542 Cervicalgia: Secondary | ICD-10-CM | POA: Diagnosis not present

## 2018-06-22 DIAGNOSIS — M546 Pain in thoracic spine: Secondary | ICD-10-CM | POA: Diagnosis not present

## 2018-06-22 DIAGNOSIS — M5412 Radiculopathy, cervical region: Secondary | ICD-10-CM | POA: Diagnosis not present

## 2018-06-22 DIAGNOSIS — M4722 Other spondylosis with radiculopathy, cervical region: Secondary | ICD-10-CM | POA: Diagnosis not present

## 2018-07-19 ENCOUNTER — Encounter: Payer: Self-pay | Admitting: Obstetrics & Gynecology

## 2018-07-19 ENCOUNTER — Ambulatory Visit (INDEPENDENT_AMBULATORY_CARE_PROVIDER_SITE_OTHER): Payer: BC Managed Care – PPO | Admitting: Obstetrics & Gynecology

## 2018-07-19 ENCOUNTER — Other Ambulatory Visit: Payer: Self-pay

## 2018-07-19 VITALS — BP 100/80 | Ht 64.0 in | Wt 136.0 lb

## 2018-07-19 DIAGNOSIS — R350 Frequency of micturition: Secondary | ICD-10-CM

## 2018-07-19 DIAGNOSIS — R300 Dysuria: Secondary | ICD-10-CM

## 2018-07-19 LAB — POCT URINALYSIS DIPSTICK
Appearance: NEGATIVE
Bilirubin, UA: NEGATIVE
Blood, UA: NEGATIVE
Glucose, UA: NEGATIVE
Ketones, UA: NEGATIVE
Leukocytes, UA: NEGATIVE
Nitrite, UA: NEGATIVE
Protein, UA: NEGATIVE
Spec Grav, UA: 1.01 (ref 1.010–1.025)
Urobilinogen, UA: NEGATIVE E.U./dL — AB
pH, UA: 5 (ref 5.0–8.0)

## 2018-07-19 NOTE — Progress Notes (Signed)
  HPI:      Ms. Laura Blackburn is a 48 y.o. [id number] who LMP was No LMP recorded. (Menstrual status: Perimenopausal)., presents today for a problem visit.    Urinary Tract Infection: Patient complains of frequency and urgency . She has had symptoms for 2 days. Patient also complains of no other pains and no dysuria. Patient denies back pain and fever. Patient does not have a history of recurrent UTI.  Patient does not have a history of pyelonephritis.   PMHx: She  has a past medical history of Anemia, Depression (2010), Ectopic pregnancy (2008), GERD (gastroesophageal reflux disease), and Hypertension. Also,  has a past surgical history that includes Tympanostomy tube placement; Lithotripsy (2004); Mandible fracture surgery (1987); Tonsillectomy (1979); Tubal ligation (1995); BTL reversal (2006); Cesarean section (2007); Laparoscopy for ectopic pregnancy (08/2006); and miscarriage (09/2006)., family history includes Alzheimer's disease in her maternal grandmother; Breast cancer (age of onset: 8) in her maternal aunt; Cancer in her paternal uncle; Diabetes in her father; Heart disease (age of onset: 63) in her paternal grandfather.,  reports that she has never smoked. She has never used smokeless tobacco. She reports that she does not drink alcohol or use drugs.  She has a current medication list which includes the following prescription(s): gabapentin, escitalopram, estrogen (conjugated)-medroxyprogesterone, hydroxyzine, and naproxen. Also, is allergic to oxycodone-acetaminophen.  Review of Systems  Constitutional: Negative for chills, fever and malaise/fatigue.  HENT: Negative for congestion, sinus pain and sore throat.   Eyes: Negative for blurred vision and pain.  Respiratory: Negative for cough and wheezing.   Cardiovascular: Negative for chest pain and leg swelling.  Gastrointestinal: Negative for abdominal pain, constipation, diarrhea, heartburn, nausea and vomiting.  Genitourinary:  Negative for dysuria, frequency, hematuria and urgency.  Musculoskeletal: Negative for back pain, joint pain, myalgias and neck pain.  Skin: Negative for itching and rash.  Neurological: Negative for dizziness, tremors and weakness.  Endo/Heme/Allergies: Does not bruise/bleed easily.  Psychiatric/Behavioral: Negative for depression. The patient is not nervous/anxious and does not have insomnia.     Objective: BP 100/80   Ht 5\' 4"  (1.626 m)   Wt 136 lb (61.7 kg)   BMI 23.34 kg/m  Physical Exam Constitutional:      General: She is not in acute distress.    Appearance: She is well-developed.  Musculoskeletal: Normal range of motion.  Neurological:     Mental Status: She is alert and oriented to person, place, and time.  Skin:    General: Skin is warm and dry.  Vitals signs reviewed.    Results for orders placed or performed in visit on 07/19/18  POCT urinalysis dipstick  Result Value Ref Range   Color, UA     Clarity, UA     Glucose, UA Negative Negative   Bilirubin, UA neg    Ketones, UA neg    Spec Grav, UA 1.010 1.010 - 1.025   Blood, UA neg    pH, UA 5.0 5.0 - 8.0   Protein, UA Negative Negative   Urobilinogen, UA negative (A) 0.2 or 1.0 E.U./dL   Nitrite, UA neg    Leukocytes, UA Negative Negative   Appearance neg    Odor      ASSESSMENT/PLAN:   Urinary urge and frequency Culture Too early to dx OAB (only 2 days of sx's)  Barnett Applebaum, MD, [facility], [facility] 07/19/2018  4:27 PM

## 2018-07-21 LAB — URINE CULTURE

## 2018-09-23 DIAGNOSIS — M67911 Unspecified disorder of synovium and tendon, right shoulder: Secondary | ICD-10-CM | POA: Diagnosis not present

## 2018-10-01 ENCOUNTER — Other Ambulatory Visit: Payer: Self-pay | Admitting: Orthopedic Surgery

## 2018-10-01 DIAGNOSIS — M67911 Unspecified disorder of synovium and tendon, right shoulder: Secondary | ICD-10-CM

## 2018-10-11 ENCOUNTER — Ambulatory Visit
Admission: RE | Admit: 2018-10-11 | Discharge: 2018-10-11 | Disposition: A | Discharge status: Home / Self Care | Payer: BLUE CROSS/BLUE SHIELD | Source: Ambulatory Visit | Attending: Orthopedic Surgery | Admitting: Orthopedic Surgery

## 2018-10-11 ENCOUNTER — Other Ambulatory Visit: Payer: Self-pay

## 2018-10-11 DIAGNOSIS — M25511 Pain in right shoulder: Secondary | ICD-10-CM | POA: Diagnosis not present

## 2018-10-11 DIAGNOSIS — M67911 Unspecified disorder of synovium and tendon, right shoulder: Secondary | ICD-10-CM

## 2018-10-11 DIAGNOSIS — S46011A Strain of muscle(s) and tendon(s) of the rotator cuff of right shoulder, initial encounter: Secondary | ICD-10-CM | POA: Diagnosis not present

## 2018-10-11 MED ORDER — IOPAMIDOL (ISOVUE-M 200) INJECTION 41%
12.0000 mL | Freq: Once | INTRAMUSCULAR | Status: AC
  Administered 2018-10-11: 12 mL via INTRA_ARTICULAR

## 2018-10-14 DIAGNOSIS — M75121 Complete rotator cuff tear or rupture of right shoulder, not specified as traumatic: Secondary | ICD-10-CM | POA: Diagnosis not present

## 2018-10-14 DIAGNOSIS — M24811 Other specific joint derangements of right shoulder, not elsewhere classified: Secondary | ICD-10-CM | POA: Diagnosis not present

## 2018-10-20 DIAGNOSIS — M19011 Primary osteoarthritis, right shoulder: Secondary | ICD-10-CM | POA: Diagnosis not present

## 2018-10-20 DIAGNOSIS — M25611 Stiffness of right shoulder, not elsewhere classified: Secondary | ICD-10-CM | POA: Diagnosis not present

## 2018-11-10 DIAGNOSIS — M7541 Impingement syndrome of right shoulder: Secondary | ICD-10-CM | POA: Diagnosis not present

## 2018-11-10 DIAGNOSIS — M19011 Primary osteoarthritis, right shoulder: Secondary | ICD-10-CM | POA: Diagnosis not present

## 2018-11-10 DIAGNOSIS — S46011A Strain of muscle(s) and tendon(s) of the rotator cuff of right shoulder, initial encounter: Secondary | ICD-10-CM | POA: Diagnosis not present

## 2018-11-18 DIAGNOSIS — M25611 Stiffness of right shoulder, not elsewhere classified: Secondary | ICD-10-CM | POA: Diagnosis not present

## 2018-11-18 DIAGNOSIS — S46091D Other injury of muscle(s) and tendon(s) of the rotator cuff of right shoulder, subsequent encounter: Secondary | ICD-10-CM | POA: Diagnosis not present

## 2018-11-23 DIAGNOSIS — M25611 Stiffness of right shoulder, not elsewhere classified: Secondary | ICD-10-CM | POA: Diagnosis not present

## 2018-11-23 DIAGNOSIS — S46091D Other injury of muscle(s) and tendon(s) of the rotator cuff of right shoulder, subsequent encounter: Secondary | ICD-10-CM | POA: Diagnosis not present

## 2018-11-25 DIAGNOSIS — M25611 Stiffness of right shoulder, not elsewhere classified: Secondary | ICD-10-CM | POA: Diagnosis not present

## 2018-11-25 DIAGNOSIS — S46091D Other injury of muscle(s) and tendon(s) of the rotator cuff of right shoulder, subsequent encounter: Secondary | ICD-10-CM | POA: Diagnosis not present

## 2018-12-01 DIAGNOSIS — S46091D Other injury of muscle(s) and tendon(s) of the rotator cuff of right shoulder, subsequent encounter: Secondary | ICD-10-CM | POA: Diagnosis not present

## 2018-12-01 DIAGNOSIS — M25611 Stiffness of right shoulder, not elsewhere classified: Secondary | ICD-10-CM | POA: Diagnosis not present

## 2018-12-03 DIAGNOSIS — S46091D Other injury of muscle(s) and tendon(s) of the rotator cuff of right shoulder, subsequent encounter: Secondary | ICD-10-CM | POA: Diagnosis not present

## 2018-12-03 DIAGNOSIS — M25611 Stiffness of right shoulder, not elsewhere classified: Secondary | ICD-10-CM | POA: Diagnosis not present

## 2018-12-07 DIAGNOSIS — S46091D Other injury of muscle(s) and tendon(s) of the rotator cuff of right shoulder, subsequent encounter: Secondary | ICD-10-CM | POA: Diagnosis not present

## 2018-12-07 DIAGNOSIS — M25611 Stiffness of right shoulder, not elsewhere classified: Secondary | ICD-10-CM | POA: Diagnosis not present

## 2018-12-16 DIAGNOSIS — M25611 Stiffness of right shoulder, not elsewhere classified: Secondary | ICD-10-CM | POA: Diagnosis not present

## 2018-12-16 DIAGNOSIS — S46091D Other injury of muscle(s) and tendon(s) of the rotator cuff of right shoulder, subsequent encounter: Secondary | ICD-10-CM | POA: Diagnosis not present

## 2018-12-20 DIAGNOSIS — M25611 Stiffness of right shoulder, not elsewhere classified: Secondary | ICD-10-CM | POA: Diagnosis not present

## 2018-12-20 DIAGNOSIS — S46091D Other injury of muscle(s) and tendon(s) of the rotator cuff of right shoulder, subsequent encounter: Secondary | ICD-10-CM | POA: Diagnosis not present

## 2018-12-28 DIAGNOSIS — S46091D Other injury of muscle(s) and tendon(s) of the rotator cuff of right shoulder, subsequent encounter: Secondary | ICD-10-CM | POA: Diagnosis not present

## 2018-12-28 DIAGNOSIS — M25611 Stiffness of right shoulder, not elsewhere classified: Secondary | ICD-10-CM | POA: Diagnosis not present

## 2018-12-31 DIAGNOSIS — M25611 Stiffness of right shoulder, not elsewhere classified: Secondary | ICD-10-CM | POA: Diagnosis not present

## 2018-12-31 DIAGNOSIS — S46091D Other injury of muscle(s) and tendon(s) of the rotator cuff of right shoulder, subsequent encounter: Secondary | ICD-10-CM | POA: Diagnosis not present

## 2019-01-03 DIAGNOSIS — S46091D Other injury of muscle(s) and tendon(s) of the rotator cuff of right shoulder, subsequent encounter: Secondary | ICD-10-CM | POA: Diagnosis not present

## 2019-01-03 DIAGNOSIS — M25611 Stiffness of right shoulder, not elsewhere classified: Secondary | ICD-10-CM | POA: Diagnosis not present

## 2019-01-05 DIAGNOSIS — M25611 Stiffness of right shoulder, not elsewhere classified: Secondary | ICD-10-CM | POA: Diagnosis not present

## 2019-01-05 DIAGNOSIS — S46091D Other injury of muscle(s) and tendon(s) of the rotator cuff of right shoulder, subsequent encounter: Secondary | ICD-10-CM | POA: Diagnosis not present

## 2019-01-10 DIAGNOSIS — S46091D Other injury of muscle(s) and tendon(s) of the rotator cuff of right shoulder, subsequent encounter: Secondary | ICD-10-CM | POA: Diagnosis not present

## 2019-01-10 DIAGNOSIS — M25611 Stiffness of right shoulder, not elsewhere classified: Secondary | ICD-10-CM | POA: Diagnosis not present

## 2019-03-17 ENCOUNTER — Other Ambulatory Visit: Payer: Self-pay | Admitting: Obstetrics and Gynecology

## 2019-03-17 NOTE — Telephone Encounter (Signed)
Advise

## 2019-05-23 ENCOUNTER — Ambulatory Visit: Payer: BC Managed Care – PPO | Admitting: Dermatology

## 2019-05-23 ENCOUNTER — Other Ambulatory Visit: Payer: Self-pay

## 2019-05-23 DIAGNOSIS — Z85828 Personal history of other malignant neoplasm of skin: Secondary | ICD-10-CM | POA: Diagnosis not present

## 2019-05-23 DIAGNOSIS — D492 Neoplasm of unspecified behavior of bone, soft tissue, and skin: Secondary | ICD-10-CM

## 2019-05-23 DIAGNOSIS — Z1283 Encounter for screening for malignant neoplasm of skin: Secondary | ICD-10-CM | POA: Diagnosis not present

## 2019-05-23 DIAGNOSIS — D485 Neoplasm of uncertain behavior of skin: Secondary | ICD-10-CM | POA: Diagnosis not present

## 2019-05-23 DIAGNOSIS — L821 Other seborrheic keratosis: Secondary | ICD-10-CM | POA: Diagnosis not present

## 2019-05-23 DIAGNOSIS — L570 Actinic keratosis: Secondary | ICD-10-CM | POA: Diagnosis not present

## 2019-05-23 NOTE — Patient Instructions (Signed)

## 2019-05-23 NOTE — Progress Notes (Signed)
   Follow-Up Visit   Subjective  Laura Blackburn is a 49 y.o. female who presents for the following: Skin Problem (check a few raised areas on my R breast and back ). Hx of BCC   The following portions of the chart were reviewed this encounter and updated as appropriate:  Tobacco  Allergies  Meds  Problems  Med Hx  Surg Hx  Fam Hx      Review of Systems:  No other skin or systemic complaints except as noted in HPI or Assessment and Plan.  Objective  Well appearing patient in no apparent distress; mood and affect are within normal limits.  A focused examination was performed including face,chest,breast,back. Relevant physical exam findings are noted in the Assessment and Plan.  Objective  Upper Back spinal: 0.6 cm crusted papule   Objective  Right Breast: Stuck-on, waxy, tan-brown papules and plaques -- Discussed benign etiology and prognosis.   Assessment & Plan    Skin cancer screening performed today.   History of Basal Cell Carcinoma of the Skin R anterior shoulder, L upper back 2018 - No evidence of recurrence today - Recommend regular full body skin exams - Recommend daily broad spectrum sunscreen SPF 30+ to sun-exposed areas, reapply every 2 hours as needed.  - Call if any new or changing lesions are noted between office visits  Neoplasm of skin Upper Back spinal  Epidermal / dermal shaving  Lesion length (cm):  0.6 Lesion width (cm):  0.6 Margin per side (cm):  0.2 Total excision diameter (cm):  1 Informed consent: discussed and consent obtained   Timeout: patient name, date of birth, surgical site, and procedure verified   Procedure prep:  Patient was prepped and draped in usual sterile fashion Prep type:  Isopropyl alcohol Anesthesia: the lesion was anesthetized in a standard fashion   Anesthetic:  1% lidocaine w/ epinephrine 1-100,000 buffered w/ 8.4% NaHCO3 Hemostasis achieved with: pressure, aluminum chloride and electrodesiccation     Outcome: patient tolerated procedure well   Post-procedure details: sterile dressing applied and wound care instructions given   Dressing type: bandage and petrolatum    Destruction of lesion Complexity: extensive   Destruction method: electrodesiccation and curettage   Informed consent: discussed and consent obtained   Timeout:  patient name, date of birth, surgical site, and procedure verified Procedure prep:  Patient was prepped and draped in usual sterile fashion Prep type:  Isopropyl alcohol Anesthesia: the lesion was anesthetized in a standard fashion   Anesthetic:  1% lidocaine w/ epinephrine 1-100,000 buffered w/ 8.4% NaHCO3 Curettage performed in three different directions: Yes   Electrodesiccation performed over the curetted area: Yes   Lesion length (cm):  0.6 Lesion width (cm):  0.6 Margin per side (cm):  0.2 Final wound size (cm):  1 Hemostasis achieved with:  pressure, aluminum chloride and electrodesiccation Outcome: patient tolerated procedure well with no complications   Post-procedure details: sterile dressing applied and wound care instructions given   Dressing type: bandage and petrolatum    Specimen 1 - Surgical pathology Differential Diagnosis: R/O BCC Check Margins: No 0.6 cm crusted papule  Seborrheic keratosis Right Breast  Observe   Skin cancer screening  Return in about 3 months (around 08/23/2019) for TBSE. IMarye Round, CMA, am acting as scribe for Sarina Ser, MD .  Documentation: I have reviewed the above documentation for accuracy and completeness, and I agree with the above.  Sarina Ser, MD

## 2019-05-26 ENCOUNTER — Telehealth: Payer: Self-pay

## 2019-05-26 NOTE — Telephone Encounter (Signed)
-----   Message from Ralene Bathe, MD sent at 05/26/2019 12:08 PM EDT ----- Skin , upper back spinal LICHENOID KERATOSIS  Benign inflamed keratosis

## 2019-05-26 NOTE — Telephone Encounter (Signed)
Advised pt of bx results/sh ?

## 2019-06-07 ENCOUNTER — Encounter: Payer: Self-pay | Admitting: Dermatology

## 2019-06-27 ENCOUNTER — Ambulatory Visit: Payer: BC Managed Care – PPO | Admitting: Nurse Practitioner

## 2019-06-27 ENCOUNTER — Telehealth: Payer: Self-pay | Admitting: Nurse Practitioner

## 2019-06-27 ENCOUNTER — Other Ambulatory Visit: Payer: Self-pay

## 2019-06-27 ENCOUNTER — Encounter: Payer: Self-pay | Admitting: Nurse Practitioner

## 2019-06-27 VITALS — BP 120/72 | HR 62 | Temp 97.7°F | Resp 16 | Ht 64.0 in | Wt 149.4 lb

## 2019-06-27 DIAGNOSIS — Z0000 Encounter for general adult medical examination without abnormal findings: Secondary | ICD-10-CM | POA: Diagnosis not present

## 2019-06-27 DIAGNOSIS — F419 Anxiety disorder, unspecified: Secondary | ICD-10-CM | POA: Insufficient documentation

## 2019-06-27 LAB — COMPREHENSIVE METABOLIC PANEL
ALT: 18 U/L (ref 0–35)
AST: 21 U/L (ref 0–37)
Albumin: 4.5 g/dL (ref 3.5–5.2)
Alkaline Phosphatase: 67 U/L (ref 39–117)
BUN: 16 mg/dL (ref 6–23)
CO2: 30 mEq/L (ref 19–32)
Calcium: 9.4 mg/dL (ref 8.4–10.5)
Chloride: 103 mEq/L (ref 96–112)
Creatinine, Ser: 0.87 mg/dL (ref 0.40–1.20)
GFR: 68.7 mL/min (ref >60.00)
Glucose, Bld: 102 mg/dL — ABNORMAL HIGH (ref 70–99)
Potassium: 4.1 mEq/L (ref 3.5–5.1)
Sodium: 138 mEq/L (ref 135–145)
Total Bilirubin: 0.5 mg/dL (ref 0.2–1.2)
Total Protein: 7.2 g/dL (ref 6.0–8.3)

## 2019-06-27 LAB — CBC WITH DIFFERENTIAL/PLATELET
Basophils Absolute: 0.1 10*3/uL (ref 0.0–0.1)
Basophils Relative: 0.9 % (ref 0.0–3.0)
Eosinophils Absolute: 0.2 10*3/uL (ref 0.0–0.7)
Eosinophils Relative: 4 % (ref 0.0–5.0)
HCT: 39.8 % (ref 36.0–46.0)
Hemoglobin: 13.3 g/dL (ref 12.0–15.0)
Lymphocytes Relative: 40.8 % (ref 12.0–46.0)
Lymphs Abs: 2.4 10*3/uL (ref 0.7–4.0)
MCHC: 33.3 g/dL (ref 30.0–36.0)
MCV: 92.6 fl (ref 78.0–100.0)
Monocytes Absolute: 0.4 10*3/uL (ref 0.1–1.0)
Monocytes Relative: 7 % (ref 3.0–12.0)
Neutro Abs: 2.7 10*3/uL (ref 1.4–7.7)
Neutrophils Relative %: 47.3 % (ref 43.0–77.0)
Platelets: 215 10*3/uL (ref 150.0–400.0)
RBC: 4.3 Mil/uL (ref 3.87–5.11)
RDW: 12.3 % (ref 11.5–15.5)
WBC: 5.8 10*3/uL (ref 4.0–10.5)

## 2019-06-27 LAB — LIPID PANEL
Cholesterol: 184 mg/dL (ref 0–200)
HDL: 66.1 mg/dL (ref >39.00)
LDL Cholesterol: 94 mg/dL (ref 0–99)
NonHDL: 117.69
Total CHOL/HDL Ratio: 3
Triglycerides: 117 mg/dL (ref 0.0–149.0)
VLDL: 23.4 mg/dL (ref 0.0–40.0)

## 2019-06-27 LAB — B12 AND FOLATE PANEL
Folate: 16.8 ng/mL (ref >5.9)
Vitamin B-12: 239 pg/mL (ref 211–911)

## 2019-06-27 LAB — HEMOGLOBIN A1C: Hgb A1c MFr Bld: 5.7 % (ref 4.6–6.5)

## 2019-06-27 LAB — VITAMIN D 25 HYDROXY (VIT D DEFICIENCY, FRACTURES): VITD: 36.02 ng/mL (ref 30.00–100.00)

## 2019-06-27 LAB — TSH: TSH: 1.3 u[IU]/mL (ref 0.35–4.50)

## 2019-06-27 MED ORDER — GABAPENTIN 300 MG PO CAPS: 300.0000 mg | ORAL_CAPSULE | Freq: Every day | ORAL | 3 refills | Status: AC

## 2019-06-27 MED ORDER — GABAPENTIN 300 MG PO CAPS: 300.0000 mg | ORAL_CAPSULE | Freq: Every day | ORAL | 3 refills | Status: DC

## 2019-06-27 NOTE — Progress Notes (Signed)
New Patient Office Visit  Subjective:  Patient ID: Laura Blackburn, female    DOB: 10/29/1970  Age: 49 y.o. MRN: [id number]  CC:  Chief Complaint  Patient presents with  . Establish Care    HPI Laura Blackburn presents to establish care with primary care.  She has several concerns today: 1. She would like to have her gabapentin refilled 2. She would like to be tested for ADHD.3.  She would like to have routine blood work done.  She has a past medical history of IDA, herpes zoster 2009, hypertension.  1.  Patient reports back pain that started after she had skin cancer removed.  Patient thinks there may have been some nerve damage. She gets pain in the right side of thoracic back exactly over the skin cancer incision site.  Gabapentin at bedtime resolves this.  She says she cannot sleep unless she takes gabapentin.  She has no difficulty during the day.  She has noted no escalation in pain and reports medication seems to work well.She has also had right shoulder rotator cuff surgery and completed physical therapy.  The shoulder is feeling better but is not yet resolved.  She does not have pain radiating into her back from her shoulder.   BP Readings from Last 3 Encounters:  06/27/19 120/72  07/19/18 100/80  04/22/18 140/82    2.. Some anxiety-patient works from home, the children at home, there is a lot of distraction.  She starts 1 task and cannot finish it.  Patient reports all of her life she has had problems focusing and was labelled a poor student when she was not.  She would like to get formal testing for ADHD.  PHQ-9 score today is 16. GAD-7: no results. She denies depression currently.   Patient presents today for complete physical.  Immunizations:.Needs TD and Shingrix. Covid 04/04/2018 01/17/1919 Diet: Regular diet Exercise: No specific exercise Colonoscopy: Requests Dexa: Pap Smear: GYN  Mammogram: requests Vision:  Dental:   Past Medical History:  Diagnosis Date    . Anemia    iron deficiency  . Chicken pox   . Depression 2010   Postpartum  . Ectopic pregnancy 2008  . GERD (gastroesophageal reflux disease)   . Hypertension     Past Surgical History:  Procedure Laterality Date  . BTL reversal  2006  . CESAREAN SECTION  2007  . LAPAROSCOPY FOR ECTOPIC PREGNANCY  08/2006   left side  . LITHOTRIPSY  2004   for kidney stones  . [place]  . miscarriage  09/2006  . TONSILLECTOMY  1979  . TUBAL LIGATION  1995   BTL  . TYMPANOSTOMY TUBE PLACEMENT     2 sets of ear tubes    Family History  Problem Relation Age of Onset  . Diabetes Father   . COPD Father   . Hearing loss Father   . Arthritis Mother   . Depression Mother   . Drug abuse Mother   . Hearing loss Mother   . Hyperlipidemia Mother   . Mental illness Mother   . Cancer Paternal Uncle        lung CA  . Alzheimer's disease Maternal Grandmother   . Early death Maternal Grandmother   . Hearing loss Maternal Grandmother   . Heart disease Maternal Grandmother   . Hyperlipidemia Maternal Grandmother   . Stroke Maternal Grandmother   . Heart disease Paternal Grandfather 35       MI  .  Early death Paternal Grandfather   . Breast cancer Maternal Aunt 52  . Heart disease Maternal Grandfather   . Hypertension Maternal Grandfather     Social History   Socioeconomic History  . Marital status: Married    Spouse name: Not on file  . Number of children: Not on file  . Years of education: Not on file  . Highest education level: Not on file  Occupational History  . Occupation: Medical illustrator  Tobacco Use  . Smoking status: Never Smoker  . Smokeless tobacco: Never Used  Vaping Use  . Vaping Use: Never used  Substance and Sexual Activity  . Alcohol use: No  . Drug use: No  . Sexual activity: Yes  Other Topics Concern  . Not on file  Social History Narrative  . Not on file   Social Determinants of Health   Financial Resource Strain:   .  Difficulty of Paying Living Expenses:   Food Insecurity:   . Worried About Charity fundraiser in the Last Year:   . Arboriculturist in the Last Year:   Transportation Needs:   . Film/video editor (Medical):   Marland Kitchen Lack of Transportation (Non-Medical):   Physical Activity:   . Days of Exercise per Week:   . Minutes of Exercise per Session:   Stress:   . Feeling of Stress :   Social Connections:   . Frequency of Communication with Friends and Family:   . Frequency of Social Gatherings with Friends and Family:   . Attends Religious Services:   . Active Member of Clubs or Organizations:   . Attends Archivist Meetings:   Marland Kitchen Marital Status:   Intimate Partner Violence:   . Fear of Current or Ex-Partner:   . Emotionally Abused:   Marland Kitchen Physically Abused:   . Sexually Abused:     ROS Review of Systems  Constitutional: Negative.   HENT: Negative.   Eyes: Negative.   Respiratory: Negative.   Cardiovascular: Negative.   Gastrointestinal: Negative.   Endocrine: Negative.   Genitourinary: Negative.   Musculoskeletal:       See HPI   Allergic/Immunologic: Negative.   Neurological: Negative.   Hematological: Negative.   Psychiatric/Behavioral:       See HPI. No SI.     Objective:   Today's Vitals: BP 120/72   Pulse 62   Temp 97.7 F (36.5 C)   Resp 16   Ht 5\' 4"  (1.626 m)   Wt 149 lb 6.4 oz (67.8 kg)   SpO2 99%   BMI 25.64 kg/m   Physical Exam Vitals reviewed.  Constitutional:      Appearance: Normal appearance. She is normal weight.  HENT:     Head: Normocephalic and atraumatic.     Right Ear: Tympanic membrane normal.     Left Ear: Tympanic membrane normal.  Eyes:     Extraocular Movements: Extraocular movements intact.     Conjunctiva/sclera: Conjunctivae normal.     Pupils: Pupils are equal, round, and reactive to light.  Cardiovascular:     Rate and Rhythm: Normal rate and regular rhythm.     Pulses: Normal pulses.     Heart sounds: Normal  heart sounds.  Pulmonary:     Effort: Pulmonary effort is normal.     Breath sounds: Normal breath sounds.  Abdominal:     General: Abdomen is flat.     Palpations: Abdomen is soft.  Musculoskeletal:  General: Normal range of motion.     Cervical back: Normal range of motion and neck supple.     Comments: Mid back with healed incision.   Skin:    General: Skin is warm and dry.  Neurological:     General: No focal deficit present.     Mental Status: She is alert and oriented to person, place, and time.  Psychiatric:        Mood and Affect: Mood normal.        Behavior: Behavior normal.        Thought Content: Thought content normal.        Judgment: Judgment normal.     Assessment & Plan:   Problem List Items Addressed This Visit      Other   Anxiety - Primary   Relevant Orders   VITAMIN D 25 Hydroxy (Vit-D Deficiency, Fractures) (Completed)   B12 and Folate Panel (Completed)   Ambulatory referral to Psychiatry   Well woman exam without gynecological exam   Relevant Orders   MM 3D SCREEN BREAST BILATERAL   Ambulatory referral to Gastroenterology   CBC with Differential/Platelet (Completed)   TSH (Completed)   Comprehensive metabolic panel (Completed)   Hemoglobin A1c (Completed)   Hepatitis C antibody   HIV Antibody (routine testing w rflx)   Lipid panel (Completed)      Outpatient Encounter Medications as of 06/27/2019  Medication Sig  . gabapentin (NEURONTIN) 300 MG capsule Take 1 capsule (300 mg total) by mouth at bedtime.  . Multiple Vitamin (MULTIVITAMIN) tablet Take 1 tablet by mouth daily.  . [DISCONTINUED] gabapentin (NEURONTIN) 300 MG capsule Take 1 capsule (300 mg total) by mouth at bedtime.  . [DISCONTINUED] gabapentin (NEURONTIN) 300 MG capsule Take 1 capsule (300 mg total) by mouth at bedtime.  . [DISCONTINUED] escitalopram (LEXAPRO) 10 MG tablet Take 1 tablet (10 mg total) by mouth daily. (Patient not taking: Reported on 07/19/2018)  .  [DISCONTINUED] hydrOXYzine (ATARAX/VISTARIL) 25 MG tablet Take 1 tablet (25 mg total) by mouth every 6 (six) hours as needed for anxiety. (Patient not taking: Reported on 07/19/2018)  . [DISCONTINUED] naproxen (NAPROSYN) 500 MG tablet Take 1 tablet (500 mg total) by mouth 2 (two) times daily with a meal. (Patient not taking: Reported on 07/19/2018)  . [DISCONTINUED] PREMPRO 0.625-5 MG tablet TAKE 1 TABLET BY MOUTH EVERY DAY   No facility-administered encounter medications on file as of 06/27/2019.   t was nice to meet you today.  Please go to the laboratory today for routine blood work.  I have ordered a mammogram, you will need to complete  Please call and schedule your 3D mammogram as discussed  [facility]  [address] [place], [place] [phone]  I have ordered referral to get tested for ADHD  I have therefore ordered a referral to GI for colonoscopy  You will be due for immunizations.  Monitor blood pressure with past history of hypertension.  Follow-up on history of zoster listed 2009.  Refilled her gabapentin she reports that is taken for incisional tenderness in mid thoracic back area where she had skin cancer removed.  Follow-up office visit in August.   Follow-up: Return in about 2 months (around 08/27/2019).  This visit occurred during the SARS-CoV-2 public health emergency.  Safety protocols were in place, including screening questions prior to the visit, additional usage of staff PPE, and extensive cleaning of exam room while observing appropriate contact time as indicated for disinfecting solutions.  Denice Paradise, NP

## 2019-06-27 NOTE — Telephone Encounter (Signed)
Pt would like a refill on Gabapentin

## 2019-06-27 NOTE — Patient Instructions (Addendum)
It was nice to meet you today.  Please go to the laboratory today for routine blood work.  I have ordered a mammogram, you will need to complete  Please call and schedule your 3D mammogram as discussed.   [facility]  [address] [place], [place] [id number]   I have ordered referral to get tested for ADHD  I have therefore ordered a referral to GI for colonoscopy  You will be due for immunizations.    Follow-up office visit in August. Managing Anxiety, Adult After being diagnosed with an anxiety disorder, you may be relieved to know why you have felt or behaved a certain way. You may also feel overwhelmed about the treatment ahead and what it will mean for your life. With care and support, you can manage this condition and recover from it. How to manage lifestyle changes Managing stress and anxiety  Stress is your body's reaction to life changes and events, both good and bad. Most stress will last just a few hours, but stress can be ongoing and can lead to more than just stress. Although stress can play a major role in anxiety, it is not the same as anxiety. Stress is usually caused by something external, such as a deadline, test, or competition. Stress normally passes after the triggering event has ended.  Anxiety is caused by something internal, such as imagining a terrible outcome or worrying that something will go wrong that will devastate you. Anxiety often does not go away even after the triggering event is over, and it can become long-term (chronic) worry. It is important to understand the differences between stress and anxiety and to manage your stress effectively so that it does not lead to an anxious response. Talk with your health care provider or a counselor to learn more about reducing anxiety and stress. He or she may suggest tension reduction techniques, such as:  Music therapy. This can include creating or listening to music that you enjoy  and that inspires you.  Mindfulness-based meditation. This involves being aware of your normal breaths while not trying to control your breathing. It can be done while sitting or walking.  Centering prayer. This involves focusing on a word, phrase, or sacred image that means something to you and brings you peace.  Deep breathing. To do this, expand your stomach and inhale slowly through your nose. Hold your breath for 3-5 seconds. Then exhale slowly, letting your stomach muscles relax.  Self-talk. This involves identifying thought patterns that lead to anxiety reactions and changing those patterns.  Muscle relaxation. This involves tensing muscles and then relaxing them. Choose a tension reduction technique that suits your lifestyle and personality. These techniques take time and practice. Set aside 5-15 minutes a day to do them. Therapists can offer counseling and training in these techniques. The training to help with anxiety may be covered by some insurance plans. Other things you can do to manage stress and anxiety include:  Keeping a stress/anxiety diary. This can help you learn what triggers your reaction and then learn ways to manage your response.  Thinking about how you react to certain situations. You may not be able to control everything, but you can control your response.  Making time for activities that help you relax and not feeling guilty about spending your time in this way.  Visual imagery and yoga can help you stay calm and relax.  Medicines Medicines can help ease symptoms. Medicines for anxiety include:  Anti-anxiety drugs.  Antidepressants. Medicines are often used as a primary treatment for anxiety disorder. Medicines will be prescribed by a health care provider. When used together, medicines, psychotherapy, and tension reduction techniques may be the most effective treatment. Relationships Relationships can play a big part in helping you recover. Try to spend more  time connecting with trusted friends and family members. Consider going to couples counseling, taking family education classes, or going to family therapy. Therapy can help you and others better understand your condition. How to recognize changes in your anxiety Everyone responds differently to treatment for anxiety. Recovery from anxiety happens when symptoms decrease and stop interfering with your daily activities at home or work. This may mean that you will start to:  Have better concentration and focus. Worry will interfere less in your daily thinking.  Sleep better.  Be less irritable.  Have more energy.  Have improved memory. It is important to recognize when your condition is getting worse. Contact your health care provider if your symptoms interfere with home or work and you feel like your condition is not improving. Follow these instructions at home: Activity  Exercise. Most adults should do the following: ? Exercise for at least 150 minutes each week. The exercise should increase your heart rate and make you sweat (moderate-intensity exercise). ? Strengthening exercises at least twice a week.  Get the right amount and quality of sleep. Most adults need 7-9 hours of sleep each night. Lifestyle   Eat a healthy diet that includes plenty of vegetables, fruits, whole grains, low-fat dairy products, and lean protein. Do not eat a lot of foods that are high in solid fats, added sugars, or salt.  Make choices that simplify your life.  Do not use any products that contain nicotine or tobacco, such as cigarettes, e-cigarettes, and chewing tobacco. If you need help quitting, ask your health care provider.  Avoid caffeine, alcohol, and certain over-the-counter cold medicines. These may make you feel worse. Ask your pharmacist which medicines to avoid. General instructions  Take over-the-counter and prescription medicines only as told by your health care provider.  Keep all follow-up  visits as told by your health care provider. This is important. Where to find support You can get help and support from these sources:  Self-help groups.  Online and [employer].  A trusted spiritual leader.  Couples counseling.  Family education classes.  Family therapy. Where to find more information You may find that joining a support group helps you deal with your anxiety. The following sources can help you locate counselors or support groups near you:  [employer]: www.mentalhealthamerica.net  Anxiety and Depression Association of [place] (ADAA): https://www.clark.net/  National Alliance on Mental Illness (NAMI): www.nami.org Contact a health care provider if you:  Have a hard time staying focused or finishing daily tasks.  Spend many hours a day feeling worried about everyday life.  Become exhausted by worry.  Start to have headaches, feel tense, or have nausea.  Urinate more than normal.  Have diarrhea. Get help right away if you have:  A racing heart and shortness of breath.  Thoughts of hurting yourself or others. If you ever feel like you may hurt yourself or others, or have thoughts about taking your own life, get help right away. You can go to your nearest emergency department or call:  Your local emergency services (911 in the [place]  A suicide crisis helpline, such as the [place] at (934)143-2312. This is open 24 hours  a day. Summary  Taking steps to learn and use tension reduction techniques can help calm you and help prevent triggering an anxiety reaction.  When used together, medicines, psychotherapy, and tension reduction techniques may be the most effective treatment.  Family, friends, and partners can play a big part in helping you recover from an anxiety disorder. This information is not intended to replace advice given to you by your health care provider. Make sure you discuss any questions you have  with your health care provider. Document Revised: 06/01/2018 Document Reviewed: 06/01/2018 Elsevier Patient Education  [place].

## 2019-06-28 LAB — HIV ANTIBODY (ROUTINE TESTING W REFLEX): HIV 1&2 Ab, 4th Generation: NONREACTIVE

## 2019-06-28 LAB — HEPATITIS C ANTIBODY
Hepatitis C Ab: NONREACTIVE
SIGNAL TO CUT-OFF: 0.01 (ref <1.00)

## 2019-06-29 ENCOUNTER — Telehealth: Payer: Self-pay | Admitting: Nurse Practitioner

## 2019-06-29 DIAGNOSIS — F419 Anxiety disorder, unspecified: Secondary | ICD-10-CM

## 2019-06-29 MED ORDER — ESCITALOPRAM OXALATE 10 MG PO TABS
10.0000 mg | ORAL_TABLET | Freq: Every day | ORAL | 1 refills | Status: DC
Start: 2019-06-29 — End: 2022-08-27

## 2019-06-29 NOTE — Telephone Encounter (Signed)
Spoke with patient about below message. She was not apposed to seeing general psychiatry but they were saying they needed to see her for anxiety and depression; she misunderstood what was talked about but also wanted to get the ADHD testing done. I advised her to call gen psy back to schedule and also that the testing center would also be calling her; she agreed to do so. I did the GAD scale and score was 13; I will try to get this entered into her chart. Patient does want to go back on the lexapro.

## 2019-06-29 NOTE — Telephone Encounter (Signed)
I will order Lexapro 10 mg daily .  Please arrange a  follow-up visit in one month.

## 2019-06-29 NOTE — Telephone Encounter (Signed)
Patient told the referral specialist she did not want to see general psychiatry.  She wanted referral to [facility] attention specialist.    PLAN: 1. [facility].   2. This will not address the PHQ-9: 16 score which screens for depression. This is a different problem then needing help with focusing and getting tasks done. She used to take Lexapro. Would she consider restarting that? Or, talking to a counselor for those feelings?

## 2019-06-29 NOTE — Telephone Encounter (Signed)
Patient aware and scheduled follow up for 07/28/19 at 8am

## 2019-07-04 ENCOUNTER — Telehealth: Payer: BC Managed Care – PPO

## 2019-07-28 ENCOUNTER — Ambulatory Visit: Payer: BC Managed Care – PPO | Admitting: Nurse Practitioner

## 2019-09-14 ENCOUNTER — Encounter: Payer: BC Managed Care – PPO | Admitting: Dermatology

## 2019-09-20 ENCOUNTER — Encounter: Payer: Self-pay | Admitting: Nurse Practitioner

## 2019-09-27 ENCOUNTER — Ambulatory Visit: Payer: BC Managed Care – PPO | Admitting: Nurse Practitioner

## 2019-10-07 ENCOUNTER — Encounter: Payer: Self-pay | Admitting: Emergency Medicine

## 2019-10-07 ENCOUNTER — Ambulatory Visit
Admission: EM | Admit: 2019-10-07 | Discharge: 2019-10-07 | Disposition: A | Discharge status: Home / Self Care | Payer: BC Managed Care – PPO | Attending: Emergency Medicine | Admitting: Emergency Medicine

## 2019-10-07 DIAGNOSIS — M546 Pain in thoracic spine: Secondary | ICD-10-CM

## 2019-10-07 LAB — POCT URINALYSIS DIP (MANUAL ENTRY)
Bilirubin, UA: NEGATIVE
Glucose, UA: NEGATIVE mg/dL
Ketones, POC UA: NEGATIVE mg/dL
Leukocytes, UA: NEGATIVE
Nitrite, UA: NEGATIVE
Protein Ur, POC: NEGATIVE mg/dL
Spec Grav, UA: 1.025 (ref 1.010–1.025)
Urobilinogen, UA: 0.2 E.U./dL
pH, UA: 5.5 (ref 5.0–8.0)

## 2019-10-07 MED ORDER — CYCLOBENZAPRINE HCL 10 MG PO TABS: 10.0000 mg | ORAL_TABLET | Freq: Two times a day (BID) | ORAL | 0 refills | Status: DC | PRN

## 2019-10-07 NOTE — Discharge Instructions (Addendum)
Take ibuprofen as needed for your pain.  Take the muscle relaxer Flexeril as needed for muscle spasm; Do not drive, operate machinery, or drink alcohol with this medication as it may make you drowsy.    Follow up with your primary care provider or an orthopedist if your pain is not improving.

## 2019-10-07 NOTE — ED Triage Notes (Signed)
Pt c/o upper back pain x 3 weeks. She states this morning she could not even put on her bra. Pt states she has been taking ibuprofen 800 mg tid. Pt also has been using biofreeze ointment for relief.

## 2019-10-07 NOTE — ED Provider Notes (Signed)
[name]    CSN: [id number] Arrival date & time: 10/07/19  1100      History   Chief Complaint Chief Complaint  Patient presents with  . Back Pain    HPI Laura Blackburn is a 49 y.o. female.   Patient presents with bilateral thoracic back pain x3 weeks.  No falls or injury.  She denies numbness, weakness, paresthesias, bowel or bladder incontinence, abdominal pain, rash, lesions, or other symptoms.  She reports urinary frequency and mild dysuria this week.  Treatment attempted at home with ibuprofen, Biofreeze, heat and cold.  The history is provided by the patient.    Past Medical History:  Diagnosis Date  . Anemia    iron deficiency  . Basal cell carcinoma 11/19/2016   right ant shoulder/superficial  . Basal cell carcinoma 12/24/2016   left upper back/excision  . Chicken pox   . Depression 2010   Postpartum  . Ectopic pregnancy 2008  . GERD (gastroesophageal reflux disease)   . Hypertension     Patient Active Problem List   Diagnosis Date Noted  . Anxiety 06/27/2019  . Well woman exam without gynecological exam 06/27/2019  . Head pain 03/10/2012  . Influenza-like illness 12/09/2010  . Elbow pain, left 09/30/2010  . Shoulder pain, left 09/30/2010  . Sinusitis 09/30/2010  . Leg pain 07/09/2010  . HERPES ZOSTER 04/04/2007  . ANEMIA-IRON DEFICIENCY 11/13/2006  . DEPRESSION 11/13/2006  . HYPERTENSION 11/13/2006  . GERD 11/13/2006    Past Surgical History:  Procedure Laterality Date  . BTL reversal  2006  . CESAREAN SECTION  2007  . LAPAROSCOPY FOR ECTOPIC PREGNANCY  08/2006   left side  . LITHOTRIPSY  2004   for kidney stones  . [place]  . miscarriage  09/2006  . TONSILLECTOMY  1979  . TUBAL LIGATION  1995   BTL  . TYMPANOSTOMY TUBE PLACEMENT     2 sets of ear tubes    OB History    Gravida  7   Para  4   Term  3   Preterm      AB  3   Living  4     SAB      TAB      Ectopic  1   Multiple       Live Births  3            Home Medications    Prior to Admission medications   Medication Sig Start Date End Date Taking? Authorizing Provider  escitalopram (LEXAPRO) 10 MG tablet Take 1 tablet (10 mg total) by mouth daily. 06/29/19  Yes Marval Regal, NP  gabapentin (NEURONTIN) 300 MG capsule Take 1 capsule (300 mg total) by mouth at bedtime. 06/27/19  Yes Marval Regal, NP  Multiple Vitamin (MULTIVITAMIN) tablet Take 1 tablet by mouth daily.   Yes [provider]  cyclobenzaprine (FLEXERIL) 10 MG tablet Take 1 tablet (10 mg total) by mouth 2 (two) times daily as needed for muscle spasms. 10/07/19   Sharion Balloon, NP    Family History Family History  Problem Relation Age of Onset  . Diabetes Father   . COPD Father   . Hearing loss Father   . Arthritis Mother   . Depression Mother   . Drug abuse Mother   . Hearing loss Mother   . Hyperlipidemia Mother   . Mental illness Mother   . Cancer Paternal Uncle  lung CA  . Alzheimer's disease Maternal Grandmother   . Early death Maternal Grandmother   . Hearing loss Maternal Grandmother   . Heart disease Maternal Grandmother   . Hyperlipidemia Maternal Grandmother   . Stroke Maternal Grandmother   . Heart disease Paternal Grandfather 76       MI  . Early death Paternal Grandfather   . Breast cancer Maternal Aunt 52  . Heart disease Maternal Grandfather   . Hypertension Maternal Grandfather     Social History Social History   Tobacco Use  . Smoking status: Never Smoker  . Smokeless tobacco: Never Used  Vaping Use  . Vaping Use: Never used  Substance Use Topics  . Alcohol use: No  . Drug use: No     Allergies   Oxycodone-acetaminophen   Review of Systems Review of Systems  Constitutional: Negative for chills and fever.  HENT: Negative for ear pain and sore throat.   Eyes: Negative for pain and visual disturbance.  Respiratory: Negative for cough and shortness of breath.     Cardiovascular: Negative for chest pain and palpitations.  Gastrointestinal: Negative for abdominal pain, diarrhea and vomiting.  Genitourinary: Positive for dysuria and frequency. Negative for hematuria.  Musculoskeletal: Positive for back pain. Negative for arthralgias.  Skin: Negative for color change and rash.  Neurological: Negative for seizures, syncope, weakness and numbness.  All other systems reviewed and are negative.    Physical Exam Triage Vital Signs ED Triage Vitals  Enc Vitals Group     BP      Pulse      Resp      Temp      Temp src      SpO2      Weight      Height      Head Circumference      Peak Flow      Pain Score      Pain Loc      Pain Edu?      Excl. in [place]?    No data found.  Updated Vital Signs BP 116/62 (BP Location: Left Arm)   Pulse 85   Temp (!) 97.5 F (36.4 C) (Oral)   Resp 16   SpO2 100%   Visual Acuity Right Eye Distance:   Left Eye Distance:   Bilateral Distance:    Right Eye Near:   Left Eye Near:    Bilateral Near:     Physical Exam Vitals and nursing note reviewed.  Constitutional:      General: She is not in acute distress.    Appearance: She is well-developed. She is not ill-appearing.  HENT:     Head: Normocephalic and atraumatic.     Mouth/Throat:     Mouth: Mucous membranes are moist.     Pharynx: Oropharynx is clear.  Eyes:     Conjunctiva/sclera: Conjunctivae normal.  Cardiovascular:     Rate and Rhythm: Normal rate and regular rhythm.     Heart sounds: No murmur heard.   Pulmonary:     Effort: Pulmonary effort is normal. No respiratory distress.     Breath sounds: Normal breath sounds.  Abdominal:     General: Bowel sounds are normal.     Palpations: Abdomen is soft.     Tenderness: There is no abdominal tenderness. There is no right CVA tenderness, left CVA tenderness, guarding or rebound.  Musculoskeletal:     Cervical back: Neck supple.     Right lower leg:  No edema.     Left lower leg: No  edema.  Skin:    General: Skin is warm and dry.     Findings: No bruising, erythema, lesion or rash.  Neurological:     General: No focal deficit present.     Mental Status: She is alert and oriented to person, place, and time.     Sensory: No sensory deficit.     Motor: No weakness.     Coordination: Coordination normal.     Gait: Gait normal.  Psychiatric:        Mood and Affect: Mood normal.        Behavior: Behavior normal.      UC Treatments / Results  Labs (all labs ordered are listed, but only abnormal results are displayed) Labs Reviewed  POCT URINALYSIS DIP (MANUAL ENTRY) - Abnormal; Notable for the following components:      Result Value   Blood, UA trace-lysed (*)    All other components within normal limits    EKG   Radiology No results found.  Procedures Procedures (including critical care time)  Medications Ordered in UC Medications - No data to display  Initial Impression / Assessment and Plan / UC Course  I have reviewed the triage vital signs and the nursing notes.  Pertinent labs & imaging results that were available during my care of the patient were reviewed by me and considered in my medical decision making (see chart for details).   Acute bilateral thoracic back pain.  Treating with ibuprofen and Flexeril.  Precautions for drowsiness with Flexeril discussed with patient.  Instructed her to follow-up with her PCP or an orthopedist if her symptoms are not improving.  Patient agrees to plan of care.   Final Clinical Impressions(s) / UC Diagnoses   Final diagnoses:  Acute bilateral thoracic back pain     Discharge Instructions     Take ibuprofen as needed for your pain.  Take the muscle relaxer Flexeril as needed for muscle spasm; Do not drive, operate machinery, or drink alcohol with this medication as it may make you drowsy.    Follow up with your primary care provider or an orthopedist if your pain is not improving.        ED  Prescriptions    Medication Sig Dispense Auth. Provider   cyclobenzaprine (FLEXERIL) 10 MG tablet Take 1 tablet (10 mg total) by mouth 2 (two) times daily as needed for muscle spasms. 20 tablet Sharion Balloon, NP     I have reviewed the PDMP during this encounter.   Sharion Balloon, NP 10/07/19 1214

## 2019-11-25 ENCOUNTER — Other Ambulatory Visit: Payer: Self-pay | Admitting: Obstetrics and Gynecology

## 2019-11-25 DIAGNOSIS — Z1231 Encounter for screening mammogram for malignant neoplasm of breast: Secondary | ICD-10-CM

## 2020-01-03 ENCOUNTER — Other Ambulatory Visit: Payer: Self-pay

## 2020-01-03 ENCOUNTER — Ambulatory Visit
Admission: RE | Admit: 2020-01-03 | Discharge: 2020-01-03 | Disposition: A | Discharge status: Home / Self Care | Payer: BC Managed Care – PPO | Source: Ambulatory Visit | Attending: Obstetrics and Gynecology | Admitting: Obstetrics and Gynecology

## 2020-01-03 DIAGNOSIS — Z1231 Encounter for screening mammogram for malignant neoplasm of breast: Secondary | ICD-10-CM

## 2020-02-10 ENCOUNTER — Encounter: Payer: Self-pay | Admitting: Obstetrics and Gynecology

## 2020-02-10 ENCOUNTER — Ambulatory Visit (INDEPENDENT_AMBULATORY_CARE_PROVIDER_SITE_OTHER): Payer: BC Managed Care – PPO | Admitting: Obstetrics and Gynecology

## 2020-02-10 ENCOUNTER — Other Ambulatory Visit: Payer: Self-pay

## 2020-02-10 ENCOUNTER — Other Ambulatory Visit (HOSPITAL_COMMUNITY)
Admission: RE | Admit: 2020-02-10 | Discharge: 2020-02-10 | Disposition: A | Discharge status: Home / Self Care | Payer: BC Managed Care – PPO | Source: Ambulatory Visit | Attending: Obstetrics and Gynecology | Admitting: Obstetrics and Gynecology

## 2020-02-10 VITALS — BP 124/78 | Ht 64.0 in | Wt 150.0 lb

## 2020-02-10 DIAGNOSIS — Z1211 Encounter for screening for malignant neoplasm of colon: Secondary | ICD-10-CM

## 2020-02-10 DIAGNOSIS — Z124 Encounter for screening for malignant neoplasm of cervix: Secondary | ICD-10-CM

## 2020-02-10 DIAGNOSIS — Z01419 Encounter for gynecological examination (general) (routine) without abnormal findings: Secondary | ICD-10-CM

## 2020-02-10 DIAGNOSIS — Z7689 Persons encountering health services in other specified circumstances: Secondary | ICD-10-CM

## 2020-02-10 DIAGNOSIS — Z1239 Encounter for other screening for malignant neoplasm of breast: Secondary | ICD-10-CM | POA: Diagnosis not present

## 2020-02-10 DIAGNOSIS — Z23 Encounter for immunization: Secondary | ICD-10-CM | POA: Diagnosis not present

## 2020-02-10 NOTE — Progress Notes (Signed)
Gynecology Annual Exam  PCP: No primary care provider on file.  Chief Complaint:  Chief Complaint  Patient presents with  . Gynecologic Exam    Annual - no concerns. RM 5    History of Present Illness:Patient is a 50 y.o. [id number] presents for annual exam. The patient has no complaints today.   LMP: Patient's last menstrual period was 11/23/2010. No postmenopausal bleeding.  The patient is sexually active. She denies dyspareunia.  The patient does perform self breast exams.  There is no notable family history of breast or ovarian cancer in her family.  The patient wears seatbelts: yes.   The patient has regular exercise: not asked.    The patient denies current symptoms of depression.     Review of Systems: Review of Systems  Constitutional: Negative for chills and fever.  HENT: Negative for congestion.   Respiratory: Negative for cough and shortness of breath.   Cardiovascular: Negative for chest pain and palpitations.  Gastrointestinal: Negative for abdominal pain, constipation, diarrhea, heartburn, nausea and vomiting.  Genitourinary: Negative for dysuria, frequency and urgency.  Skin: Negative for itching and rash.  Neurological: Negative for dizziness and headaches.  Endo/Heme/Allergies: Negative for polydipsia.  Psychiatric/Behavioral: Negative for depression.    Past Medical History:  Patient Active Problem List   Diagnosis Date Noted  . Anxiety 06/27/2019  . Well woman exam without gynecological exam 06/27/2019  . Head pain 03/10/2012  . Influenza-like illness 12/09/2010  . Elbow pain, left 09/30/2010  . Shoulder pain, left 09/30/2010  . Sinusitis 09/30/2010  . Leg pain 07/09/2010  . HERPES ZOSTER 04/04/2007    Qualifier: Diagnosis of  By: Maxie Better FNP, Rosalita Levan    . ANEMIA-IRON DEFICIENCY 11/13/2006    Qualifier: Diagnosis of  By: Fuller Plan CMA (AAMA), [place]     . DEPRESSION 11/13/2006    Qualifier: Diagnosis of  By: Fuller Plan CMA ([place]),  Lugene     . HYPERTENSION 11/13/2006    Annotation: During pregnancy Qualifier: Diagnosis of  By: Fuller Plan CMA (AAMA), Lugene     . GERD 11/13/2006    Qualifier: Diagnosis of  By: Fuller Plan CMA (AAMA), Terri Skains       Past Surgical History:  Past Surgical History:  Procedure Laterality Date  . BTL reversal  2006  . CESAREAN SECTION  2007  . LAPAROSCOPY FOR ECTOPIC PREGNANCY  08/2006   left side  . LITHOTRIPSY  2004   for kidney stones  . [place]  . miscarriage  09/2006  . TONSILLECTOMY  1979  . TUBAL LIGATION  1995   BTL  . TYMPANOSTOMY TUBE PLACEMENT     2 sets of ear tubes    Gynecologic History:  Patient's last menstrual period was 11/23/2010. Last Pap: Results were: 05/14/2015 NIL and HR HPV negative  Last mammogram: 01/03/2020 Results were: Gillian Shields I  Obstetric History: [id number]  Family History:  Family History  Problem Relation Age of Onset  . Diabetes Father   . COPD Father   . Hearing loss Father   . Arthritis Mother   . Depression Mother   . Drug abuse Mother   . Hearing loss Mother   . Hyperlipidemia Mother   . Mental illness Mother   . Cancer Paternal Uncle        lung CA  . Alzheimer's disease Maternal Grandmother   . Early death Maternal Grandmother   . Hearing loss Maternal Grandmother   . Heart disease Maternal Grandmother   .  Hyperlipidemia Maternal Grandmother   . Stroke Maternal Grandmother   . Heart disease Paternal Grandfather 18       MI  . Early death Paternal Grandfather   . Breast cancer Maternal Aunt 52  . Heart disease Maternal Grandfather   . Hypertension Maternal Grandfather     Social History:  Social History   Socioeconomic History  . Marital status: Married    Spouse name: Not on file  . Number of children: Not on file  . Years of education: Not on file  . Highest education level: Not on file  Occupational History  . Occupation: Advertising account planner  Tobacco Use  . Smoking status: Never Smoker  .  Smokeless tobacco: Never Used  Vaping Use  . Vaping Use: Never used  Substance and Sexual Activity  . Alcohol use: No  . Drug use: No  . Sexual activity: Yes  Other Topics Concern  . Not on file  Social History Narrative  . Not on file   Social Determinants of Health   Financial Resource Strain: Not on file  Food Insecurity: Not on file  Transportation Needs: Not on file  Physical Activity: Not on file  Stress: Not on file  Social Connections: Not on file  Intimate Partner Violence: Not on file    Allergies:  Allergies  Allergen Reactions  . Oxycodone-Acetaminophen     REACTION: Hallucinations    Medications: Prior to Admission medications   Medication Sig Start Date End Date Taking? Authorizing Provider  cyclobenzaprine (FLEXERIL) 10 MG tablet Take 1 tablet (10 mg total) by mouth 2 (two) times daily as needed for muscle spasms. 10/07/19   Mickie Bail, NP  escitalopram (LEXAPRO) 10 MG tablet Take 1 tablet (10 mg total) by mouth daily. 06/29/19   Theadore Nan, NP  gabapentin (NEURONTIN) 300 MG capsule Take 1 capsule (300 mg total) by mouth at bedtime. 06/27/19   Theadore Nan, NP  Multiple Vitamin (MULTIVITAMIN) tablet Take 1 tablet by mouth daily.    [provider]    Physical Exam Vitals: Blood pressure 124/78, height 5\' 4"  (1.626 m), weight 150 lb (68 kg), last menstrual period 11/23/2010.  General: NAD HEENT: normocephalic, anicteric Thyroid: no enlargement, no palpable nodules Pulmonary: No increased work of breathing, CTAB Cardiovascular: RRR, distal pulses 2+ Breast: Breast symmetrical, no tenderness, no palpable nodules or masses, no skin or nipple retraction present, no nipple discharge.  No axillary or supraclavicular lymphadenopathy. Abdomen: NABS, soft, non-tender, non-distended.  Umbilicus without lesions.  No hepatomegaly, splenomegaly or masses palpable. No evidence of hernia  Genitourinary:  External: Normal external female genitalia.   Normal urethral meatus, normal Bartholin's and Skene's glands.    Vagina: Normal vaginal mucosa, no evidence of prolapse.    Cervix: Grossly normal in appearance, no bleeding  Uterus: Non-enlarged, mobile, normal contour.  No CMT  Adnexa: ovaries non-enlarged, no adnexal masses  Rectal: deferred  Lymphatic: no evidence of inguinal lymphadenopathy Extremities: no edema, erythema, or tenderness Neurologic: Grossly intact Psychiatric: mood appropriate, affect full  Female chaperone present for pelvic and breast  portions of the physical exam     Assessment: 50 y.o. 42 routine annual exam  Plan: Problem List Items Addressed This Visit   None   Visit Diagnoses    Encounter for gynecological examination without abnormal finding    -  Primary   Breast screening       Screening for malignant neoplasm of cervix       Relevant  Orders   Cytology - PAP   Colon cancer screening       Relevant Orders   Ambulatory referral to Gastroenterology   Encounter to establish care       Relevant Orders   Ambulatory referral to Internal Medicine   Need for immunization against influenza       Relevant Orders   Flu Vaccine QUAD 36+ mos IM (Completed)      1) Mammogram - recommend yearly screening mammogram.  Mammogram Was ordered today  2) STI screening  was notoffered and therefore not obtained  3) ASCCP guidelines and rational discussed.  Patient opts for every 3 years screening interval  4) Osteoporosis  - per USPTF routine screening DEXA at age 36  5) Routine healthcare maintenance including cholesterol, diabetes screening discussed  - not established with PCP referral sent  6) Colonoscopy ordered  7) Return in about 1 year (around 02/09/2021) for annual exam.  Malachy Mood, MD Mosetta Pigeon, [facility] 02/10/2020, 10:36 AM

## 2020-02-14 DIAGNOSIS — Z20828 Contact with and (suspected) exposure to other viral communicable diseases: Secondary | ICD-10-CM | POA: Diagnosis not present

## 2020-02-14 LAB — CYTOLOGY - PAP
Comment: NEGATIVE
Diagnosis: NEGATIVE
High risk HPV: NEGATIVE

## 2020-02-15 ENCOUNTER — Telehealth (INDEPENDENT_AMBULATORY_CARE_PROVIDER_SITE_OTHER): Payer: Self-pay | Admitting: Gastroenterology

## 2020-02-15 ENCOUNTER — Other Ambulatory Visit: Payer: Self-pay

## 2020-02-15 DIAGNOSIS — Z1211 Encounter for screening for malignant neoplasm of colon: Secondary | ICD-10-CM

## 2020-02-15 MED ORDER — PEG 3350-KCL-NA BICARB-NACL 420 G PO SOLR: 4000.0000 mL | Freq: Once | ORAL | 0 refills | Status: AC

## 2020-02-15 NOTE — Progress Notes (Signed)
Gastroenterology Pre-Procedure Review  Request Date: Friday 03/16/20 Requesting Physician: Dr. Bonna Gains  PATIENT REVIEW QUESTIONS: The patient responded to the following health history questions as indicated:    1. Are you having any GI issues? no 2. Do you have a personal history of Polyps? no 3. Do you have a family history of Colon Cancer or Polyps? no 4. Diabetes Mellitus? no 5. Joint replacements in the past 12 months?no 6. Major health problems in the past 3 months?no 7. Any artificial heart valves, MVP, or defibrillator?no    MEDICATIONS & ALLERGIES:    Patient reports the following regarding taking any anticoagulation/antiplatelet therapy:   Plavix, Coumadin, Eliquis, Xarelto, Lovenox, Pradaxa, Brilinta, or Effient? no Aspirin? no  Patient confirms/reports the following medications:  Current Outpatient Medications  Medication Sig Dispense Refill  . Ascorbic Acid (VITAMIN C) 1000 MG tablet Take 1,000 mg by mouth daily.    Marland Kitchen gabapentin (NEURONTIN) 300 MG capsule Take 1 capsule (300 mg total) by mouth at bedtime. 90 capsule 3  . Multiple Vitamin (MULTIVITAMIN) tablet Take 1 tablet by mouth daily.    . cyclobenzaprine (FLEXERIL) 10 MG tablet Take 1 tablet (10 mg total) by mouth 2 (two) times daily as needed for muscle spasms. (Patient not taking: Reported on 02/15/2020) 20 tablet 0  . escitalopram (LEXAPRO) 10 MG tablet Take 1 tablet (10 mg total) by mouth daily. (Patient not taking: No sig reported) 30 tablet 1   No current facility-administered medications for this visit.    Patient confirms/reports the following allergies:  Allergies  Allergen Reactions  . Oxycodone-Acetaminophen     REACTION: Hallucinations    No orders of the defined types were placed in this encounter.   AUTHORIZATION INFORMATION Primary Insurance: 1D#: Group #:  Secondary Insurance: 1D#: Group #:  SCHEDULE INFORMATION: Date: 03/16/20 Time: Location:[facility]

## 2020-03-11 DIAGNOSIS — M25561 Pain in right knee: Secondary | ICD-10-CM | POA: Diagnosis not present

## 2020-03-14 ENCOUNTER — Other Ambulatory Visit: Payer: Self-pay

## 2020-03-14 ENCOUNTER — Other Ambulatory Visit
Admission: RE | Admit: 2020-03-14 | Discharge: 2020-03-14 | Disposition: A | Discharge status: Home / Self Care | Payer: BC Managed Care – PPO | Source: Ambulatory Visit | Attending: Gastroenterology | Admitting: Gastroenterology

## 2020-03-14 DIAGNOSIS — Z01812 Encounter for preprocedural laboratory examination: Secondary | ICD-10-CM | POA: Insufficient documentation

## 2020-03-14 DIAGNOSIS — Z20822 Contact with and (suspected) exposure to covid-19: Secondary | ICD-10-CM | POA: Insufficient documentation

## 2020-03-14 DIAGNOSIS — K5730 Diverticulosis of large intestine without perforation or abscess without bleeding: Secondary | ICD-10-CM | POA: Diagnosis not present

## 2020-03-14 DIAGNOSIS — Z885 Allergy status to narcotic agent status: Secondary | ICD-10-CM | POA: Diagnosis not present

## 2020-03-14 DIAGNOSIS — Z1211 Encounter for screening for malignant neoplasm of colon: Secondary | ICD-10-CM | POA: Diagnosis not present

## 2020-03-14 LAB — SARS CORONAVIRUS 2 (TAT 6-24 HRS): SARS Coronavirus 2: NEGATIVE

## 2020-03-15 ENCOUNTER — Encounter: Payer: Self-pay | Admitting: Gastroenterology

## 2020-03-16 ENCOUNTER — Encounter
Admission: RE | Disposition: A | Discharge status: Home / Self Care | Payer: Self-pay | Source: Home / Self Care | Attending: Gastroenterology

## 2020-03-16 ENCOUNTER — Ambulatory Visit: Payer: BC Managed Care – PPO | Admitting: Anesthesiology

## 2020-03-16 ENCOUNTER — Encounter: Payer: Self-pay | Admitting: Gastroenterology

## 2020-03-16 ENCOUNTER — Ambulatory Visit
Admission: RE | Admit: 2020-03-16 | Discharge: 2020-03-16 | Disposition: A | Discharge status: Home / Self Care | Payer: BC Managed Care – PPO | Attending: Gastroenterology | Admitting: Gastroenterology

## 2020-03-16 ENCOUNTER — Other Ambulatory Visit: Payer: Self-pay

## 2020-03-16 DIAGNOSIS — Z20822 Contact with and (suspected) exposure to covid-19: Secondary | ICD-10-CM | POA: Insufficient documentation

## 2020-03-16 DIAGNOSIS — Z885 Allergy status to narcotic agent status: Secondary | ICD-10-CM | POA: Diagnosis not present

## 2020-03-16 DIAGNOSIS — Z1211 Encounter for screening for malignant neoplasm of colon: Secondary | ICD-10-CM | POA: Insufficient documentation

## 2020-03-16 DIAGNOSIS — K5790 Diverticulosis of intestine, part unspecified, without perforation or abscess without bleeding: Secondary | ICD-10-CM | POA: Diagnosis not present

## 2020-03-16 DIAGNOSIS — K5730 Diverticulosis of large intestine without perforation or abscess without bleeding: Secondary | ICD-10-CM | POA: Insufficient documentation

## 2020-03-16 HISTORY — DX: Other complications of anesthesia, initial encounter: T88.59XA

## 2020-03-16 HISTORY — DX: Personal history of urinary calculi: Z87.442

## 2020-03-16 HISTORY — PX: COLONOSCOPY WITH PROPOFOL: SHX5780

## 2020-03-16 SURGERY — COLONOSCOPY WITH PROPOFOL
Anesthesia: General

## 2020-03-16 MED ORDER — DEXMEDETOMIDINE HCL IN NACL 200 MCG/50ML IV SOLN
INTRAVENOUS | Status: DC | PRN
  Administered 2020-03-16: 8 ug via INTRAVENOUS
  Administered 2020-03-16: 12 ug via INTRAVENOUS

## 2020-03-16 MED ORDER — LIDOCAINE HCL (CARDIAC) PF 100 MG/5ML IV SOSY
PREFILLED_SYRINGE | INTRAVENOUS | Status: DC | PRN
  Administered 2020-03-16: 80 mg via INTRAVENOUS

## 2020-03-16 MED ORDER — PROPOFOL 500 MG/50ML IV EMUL
INTRAVENOUS | Status: DC | PRN
  Administered 2020-03-16: 140 ug/kg/min via INTRAVENOUS

## 2020-03-16 MED ORDER — SODIUM CHLORIDE 0.9 % IV SOLN
INTRAVENOUS | Status: DC
  Administered 2020-03-16: 1000 mL via INTRAVENOUS

## 2020-03-16 MED ORDER — PROPOFOL 10 MG/ML IV BOLUS
INTRAVENOUS | Status: DC | PRN
  Administered 2020-03-16: 40 mg via INTRAVENOUS
  Administered 2020-03-16: 80 mg via INTRAVENOUS

## 2020-03-16 NOTE — Anesthesia Preprocedure Evaluation (Signed)
Anesthesia Evaluation  Patient identified by MRN, date of birth, ID band Patient awake    Reviewed: Allergy & Precautions, NPO status , Patient's Chart, lab work & pertinent test results  History of Anesthesia Complications (+) PONV and history of anesthetic complications  Airway Mallampati: II  TM Distance: >3 FB Neck ROM: Full    Dental no notable dental hx.    Pulmonary neg pulmonary ROS, neg sleep apnea, neg COPD,    breath sounds clear to auscultation- rhonchi (-) wheezing      Cardiovascular Exercise Tolerance: Good hypertension, Pt. on medications (-) CAD, (-) Past MI, (-) Cardiac Stents and (-) CABG  Rhythm:Regular Rate:Normal - Systolic murmurs and - Diastolic murmurs    Neuro/Psych  Headaches, neg Seizures PSYCHIATRIC DISORDERS Anxiety Depression    GI/Hepatic Neg liver ROS, GERD  ,  Endo/Other  negative endocrine ROSneg diabetes  Renal/GU negative Renal ROS     Musculoskeletal negative musculoskeletal ROS (+)   Abdominal (+) - obese,   Peds  Hematology  (+) anemia ,   Anesthesia Other Findings Past Medical History: No date: Anemia     Comment:  iron deficiency 11/19/2016: Basal cell carcinoma     Comment:  right ant shoulder/superficial 12/24/2016: Basal cell carcinoma     Comment:  left upper back/excision No date: Chicken pox No date: Complication of anesthesia 2010: Depression     Comment:  Postpartum 2008: Ectopic pregnancy No date: GERD (gastroesophageal reflux disease) No date: History of kidney stones No date: Hypertension   Reproductive/Obstetrics                             Anesthesia Physical Anesthesia Plan  ASA: II  Anesthesia Plan: General   Post-op Pain Management:    Induction: Intravenous  PONV Risk Score and Plan: 3 and Propofol infusion  Airway Management Planned: Natural Airway  Additional Equipment:   Intra-op Plan:   Post-operative  Plan:   Informed Consent: I have reviewed the patients History and Physical, chart, labs and discussed the procedure including the risks, benefits and alternatives for the proposed anesthesia with the patient or authorized representative who has indicated his/her understanding and acceptance.     Dental advisory given  Plan Discussed with: CRNA and Anesthesiologist  Anesthesia Plan Comments:         Anesthesia Quick Evaluation

## 2020-03-16 NOTE — Transfer of Care (Signed)
Immediate Anesthesia Transfer of Care Note  Patient: SHELBEY SPINDLER  Procedure(s) Performed: COLONOSCOPY WITH PROPOFOL (N/A )  Patient Location: PACU  Anesthesia Type:General  Level of Consciousness: sedated  Airway & Oxygen Therapy: Patient Spontanous Breathing and Patient connected to nasal cannula oxygen  Post-op Assessment: Report given to RN and Post -op Vital signs reviewed and stable  Post vital signs: Reviewed and stable  Last Vitals:  Vitals Value Taken Time  BP 101/60 03/16/20 1023  Temp    Pulse 84 03/16/20 1025  Resp 19 03/16/20 1025  SpO2 100 % 03/16/20 1025  Vitals shown include unvalidated device data.  Last Pain:  Vitals:   03/16/20 1023  TempSrc:   PainSc: 0-No pain         Complications: No complications documented.

## 2020-03-16 NOTE — Anesthesia Postprocedure Evaluation (Signed)
Anesthesia Post Note  Patient: Laura Blackburn  Procedure(s) Performed: COLONOSCOPY WITH PROPOFOL (N/A )  Patient location during evaluation: Endoscopy Anesthesia Type: General Level of consciousness: awake and alert and oriented Pain management: pain level controlled Vital Signs Assessment: post-procedure vital signs reviewed and stable Respiratory status: spontaneous breathing, nonlabored ventilation and respiratory function stable Cardiovascular status: blood pressure returned to baseline and stable Postop Assessment: no signs of nausea or vomiting Anesthetic complications: no   No complications documented.   Last Vitals:  Vitals:   03/16/20 1043 03/16/20 1053  BP: 116/64 107/64  Pulse: 80 79  Resp: (!) 21 20  Temp:    SpO2: 100% 100%    Last Pain:  Vitals:   03/16/20 1053  TempSrc:   PainSc: 0-No pain                 Amy Penwarden

## 2020-03-16 NOTE — Op Note (Signed)
[facility] Gastroenterology Patient Name: Laura Blackburn Procedure Date: 03/16/2020 9:49 AM MRN: [id number] Account #: [id number] Date of Birth: 1970-01-23 Admit Type: Outpatient Age: 50 Room: [facility] ENDO ROOM 2 Gender: Female Note Status: Finalized Procedure:             Colonoscopy Indications:           Screening for colorectal malignant neoplasm Providers:             Shaylee Stanislawski B. Bonna Gains MD, MD Referring MD:          Forest Gleason Md, MD (Referring MD) Medicines:             Monitored Anesthesia Care Complications:         No immediate complications. Procedure:             Pre-Anesthesia Assessment:                        - Prior to the procedure, a History and Physical was                         performed, and patient medications, allergies and                         sensitivities were reviewed. The patient's tolerance                         of previous anesthesia was reviewed.                        - The risks and benefits of the procedure and the                         sedation options and risks were discussed with the                         patient. All questions were answered and informed                         consent was obtained.                        - Patient identification and proposed procedure were                         verified prior to the procedure by the physician, the                         nurse, the anesthetist and the technician. The                         procedure was verified in the pre-procedure area in                         the procedure room in the endoscopy suite.                        - ASA Grade Assessment: II - A patient with mild  systemic disease.                        - After reviewing the risks and benefits, the patient                         was deemed in satisfactory condition to undergo the                         procedure.                        After obtaining informed consent, the  colonoscope was                         passed under direct vision. Throughout the procedure,                         the patient's blood pressure, pulse, and oxygen                         saturations were monitored continuously. The                         Colonoscope was introduced through the anus and                         advanced to the the cecum, identified by appendiceal                         orifice and ileocecal valve. The colonoscopy was                         performed with ease. The patient tolerated the                         procedure well. The quality of the bowel preparation                         was good. Findings:      The perianal and digital rectal examinations were normal.      Multiple diverticula were found in the sigmoid colon.      The exam was otherwise without abnormality.      The rectum, sigmoid colon, descending colon, transverse colon, ascending       colon and cecum appeared normal.      The retroflexed view of the distal rectum and anal verge was normal and       showed no anal or rectal abnormalities. Impression:            - Diverticulosis in the sigmoid colon.                        - The examination was otherwise normal.                        - The rectum, sigmoid colon, descending colon,                         transverse colon, ascending colon and cecum are normal.                        -  The distal rectum and anal verge are normal on                         retroflexion view.                        - No specimens collected. Recommendation:        - Discharge patient to home.                        - Resume previous diet.                        - Continue present medications.                        - Repeat colonoscopy in 10 years for screening                         purposes.                        - Return to primary care physician as previously                         scheduled.                        - The findings and  recommendations were discussed with                         the patient.                        - The findings and recommendations were discussed with                         the patient's family.                        - High fiber diet.                        - In the future, if patient develops new symptoms such                         as blood per rectum, abdominal pain, weight loss,                         altered bowel habits or any other reason for concern,                         patient should discuss this with thier PCP as they may                         need a GI referral at that time or evaluation for need                         for colonoscopy earlier than the recommended screening  colonoscopy.                        In addition, if patient's family history of colon                         cancer changes (no family history at this time) in the                         future, earlier screening may be indicated and patient                         should discuss this with PCP as well. Procedure Code(s):     --- Professional ---                        [phone], Colonoscopy, flexible; diagnostic, including                         collection of specimen(s) by brushing or washing, when                         performed (separate procedure) Diagnosis Code(s):     --- Professional ---                        Z12.11, Encounter for screening for malignant neoplasm                         of colon CPT copyright 2019 American Medical Association. All rights reserved. The codes documented in this report are preliminary and upon coder review may  be revised to meet current compliance requirements.  Vonda Antigua, MD Margretta Sidle B. Bonna Gains MD, MD 03/16/2020 10:28:51 AM This report has been signed electronically. Number of Addenda: 0 Note Initiated On: 03/16/2020 9:49 AM Scope Withdrawal Time: 0 hours 16 minutes 16 seconds  Total Procedure Duration: 0 hours 23 minutes  21 seconds       [facility]

## 2020-03-16 NOTE — H&P (Signed)
Vonda Antigua, MD [address] [place], [place], [place], [address] [address], [place], [place], [place], [address] Phone: [phone]  Fax: [phone]  Primary Care Physician:  Pcp, No   Pre-Procedure History & Physical: HPI:  Laura Blackburn is a 50 y.o. female is here for a colonoscopy.   Past Medical History:  Diagnosis Date  . Anemia    iron deficiency  . Basal cell carcinoma 11/19/2016   right ant shoulder/superficial  . Basal cell carcinoma 12/24/2016   left upper back/excision  . Chicken pox   . Complication of anesthesia   . Depression 2010   Postpartum  . Ectopic pregnancy 2008  . GERD (gastroesophageal reflux disease)   . History of kidney stones   . Hypertension     Past Surgical History:  Procedure Laterality Date  . BTL reversal  2006  . CESAREAN SECTION  2007  . FRACTURE SURGERY    . LAPAROSCOPY FOR ECTOPIC PREGNANCY  08/2006   left side  . LITHOTRIPSY  2004   for kidney stones  . [place]  . miscarriage  09/2006  . TONSILLECTOMY  1979  . TUBAL LIGATION  1995   BTL  . TYMPANOSTOMY TUBE PLACEMENT     2 sets of ear tubes    Prior to Admission medications   Medication Sig Start Date End Date Taking? Authorizing Provider  Ascorbic Acid (VITAMIN C) 1000 MG tablet Take 1,000 mg by mouth daily.   Yes [provider]  gabapentin (NEURONTIN) 300 MG capsule Take 1 capsule (300 mg total) by mouth at bedtime. 06/27/19  Yes Marval Regal, NP  Multiple Vitamin (MULTIVITAMIN) tablet Take 1 tablet by mouth daily.   Yes [provider]  cyclobenzaprine (FLEXERIL) 10 MG tablet Take 1 tablet (10 mg total) by mouth 2 (two) times daily as needed for muscle spasms. Patient not taking: No sig reported 10/07/19   Sharion Balloon, NP  escitalopram (LEXAPRO) 10 MG tablet Take 1 tablet (10 mg total) by mouth daily. Patient not taking: No sig reported 06/29/19   Marval Regal, NP    Allergies as of  02/15/2020 - Review Complete 02/15/2020  Allergen Reaction Noted  . Oxycodone-acetaminophen      Family History  Problem Relation Age of Onset  . Diabetes Father   . COPD Father   . Hearing loss Father   . Arthritis Mother   . Depression Mother   . Drug abuse Mother   . Hearing loss Mother   . Hyperlipidemia Mother   . Mental illness Mother   . Cancer Paternal Uncle        lung CA  . Alzheimer's disease Maternal Grandmother   . Early death Maternal Grandmother   . Hearing loss Maternal Grandmother   . Heart disease Maternal Grandmother   . Hyperlipidemia Maternal Grandmother   . Stroke Maternal Grandmother   . Heart disease Paternal Grandfather 85       MI  . Early death Paternal Grandfather   . Breast cancer Maternal Aunt 52  . Heart disease Maternal Grandfather   . Hypertension Maternal Grandfather     Social History   Socioeconomic History  . Marital status: Married    Spouse name: Not on file  . Number of children: Not on file  . Years of education: Not on file  . Highest education level: Not on file  Occupational History  . Occupation: Medical illustrator  Tobacco Use  . Smoking  status: Never Smoker  . Smokeless tobacco: Never Used  Vaping Use  . Vaping Use: Never used  Substance and Sexual Activity  . Alcohol use: No  . Drug use: No  . Sexual activity: Yes  Other Topics Concern  . Not on file  Social History Narrative  . Not on file   Social Determinants of Health   Financial Resource Strain: Not on file  Food Insecurity: Not on file  Transportation Needs: Not on file  Physical Activity: Not on file  Stress: Not on file  Social Connections: Not on file  Intimate Partner Violence: Not on file    Review of Systems: See HPI, otherwise negative ROS  Physical Exam: BP 111/79   Pulse 96   Temp 97.9 F (36.6 C) (Temporal)   Resp 20   Ht 5\' 4"  (1.626 m)   Wt 66.8 kg   LMP 11/23/2010   SpO2 100%   BMI 25.28 kg/m  General:   Alert,  pleasant  and cooperative in NAD Head:  Normocephalic and atraumatic. Neck:  Supple; no masses or thyromegaly. Lungs:  Clear throughout to auscultation, normal respiratory effort.    Heart:  +S1, +S2, Regular rate and rhythm, No edema. Abdomen:  Soft, nontender and nondistended. Normal bowel sounds, without guarding, and without rebound.   Neurologic:  Alert and  oriented x4;  grossly normal neurologically.  Impression/Plan: Laura Blackburn is here for a colonoscopy to be performed for average risk screening.  Risks, benefits, limitations, and alternatives regarding  colonoscopy have been reviewed with the patient.  Questions have been answered.  All parties agreeable.   Virgel Manifold, MD  03/16/2020, 9:45 AM

## 2020-03-19 ENCOUNTER — Encounter: Payer: Self-pay | Admitting: Gastroenterology

## 2020-03-20 DIAGNOSIS — M25561 Pain in right knee: Secondary | ICD-10-CM | POA: Diagnosis not present

## 2020-03-20 DIAGNOSIS — R635 Abnormal weight gain: Secondary | ICD-10-CM | POA: Diagnosis not present

## 2020-03-20 DIAGNOSIS — Z13220 Encounter for screening for lipoid disorders: Secondary | ICD-10-CM | POA: Diagnosis not present

## 2020-03-20 DIAGNOSIS — N951 Menopausal and female climacteric states: Secondary | ICD-10-CM | POA: Diagnosis not present

## 2020-04-05 ENCOUNTER — Ambulatory Visit: Payer: BC Managed Care – PPO | Admitting: Dermatology

## 2020-04-18 DIAGNOSIS — R059 Cough, unspecified: Secondary | ICD-10-CM | POA: Diagnosis not present

## 2020-04-25 DIAGNOSIS — Z13220 Encounter for screening for lipoid disorders: Secondary | ICD-10-CM | POA: Diagnosis not present

## 2020-04-25 DIAGNOSIS — Z131 Encounter for screening for diabetes mellitus: Secondary | ICD-10-CM | POA: Diagnosis not present

## 2020-05-03 DIAGNOSIS — Z0000 Encounter for general adult medical examination without abnormal findings: Secondary | ICD-10-CM | POA: Diagnosis not present

## 2020-05-03 DIAGNOSIS — N951 Menopausal and female climacteric states: Secondary | ICD-10-CM | POA: Diagnosis not present

## 2020-05-03 DIAGNOSIS — R635 Abnormal weight gain: Secondary | ICD-10-CM | POA: Diagnosis not present

## 2020-05-03 DIAGNOSIS — M7918 Myalgia, other site: Secondary | ICD-10-CM | POA: Diagnosis not present

## 2020-05-03 DIAGNOSIS — Z1331 Encounter for screening for depression: Secondary | ICD-10-CM | POA: Diagnosis not present

## 2020-07-27 DIAGNOSIS — N951 Menopausal and female climacteric states: Secondary | ICD-10-CM | POA: Diagnosis not present

## 2020-07-27 DIAGNOSIS — R635 Abnormal weight gain: Secondary | ICD-10-CM | POA: Diagnosis not present

## 2020-07-27 DIAGNOSIS — G8929 Other chronic pain: Secondary | ICD-10-CM | POA: Diagnosis not present

## 2020-07-27 DIAGNOSIS — M7918 Myalgia, other site: Secondary | ICD-10-CM | POA: Diagnosis not present

## 2020-10-29 DIAGNOSIS — N951 Menopausal and female climacteric states: Secondary | ICD-10-CM | POA: Diagnosis not present

## 2020-10-29 DIAGNOSIS — R635 Abnormal weight gain: Secondary | ICD-10-CM | POA: Diagnosis not present

## 2020-10-29 DIAGNOSIS — R058 Other specified cough: Secondary | ICD-10-CM | POA: Diagnosis not present

## 2020-10-29 DIAGNOSIS — M7918 Myalgia, other site: Secondary | ICD-10-CM | POA: Diagnosis not present

## 2021-03-18 ENCOUNTER — Other Ambulatory Visit: Payer: Self-pay | Admitting: Family Medicine

## 2021-03-18 DIAGNOSIS — Z1231 Encounter for screening mammogram for malignant neoplasm of breast: Secondary | ICD-10-CM

## 2021-04-23 ENCOUNTER — Ambulatory Visit
Admission: RE | Admit: 2021-04-23 | Discharge: 2021-04-23 | Disposition: A | Discharge status: Home / Self Care | Payer: BC Managed Care – PPO | Source: Ambulatory Visit | Attending: Family Medicine | Admitting: Family Medicine

## 2021-04-23 DIAGNOSIS — Z1231 Encounter for screening mammogram for malignant neoplasm of breast: Secondary | ICD-10-CM | POA: Diagnosis present

## 2021-07-09 ENCOUNTER — Ambulatory Visit: Payer: BC Managed Care – PPO | Admitting: Dermatology

## 2021-07-09 DIAGNOSIS — D2371 Other benign neoplasm of skin of right lower limb, including hip: Secondary | ICD-10-CM | POA: Diagnosis not present

## 2021-07-09 DIAGNOSIS — D492 Neoplasm of unspecified behavior of bone, soft tissue, and skin: Secondary | ICD-10-CM

## 2021-07-09 DIAGNOSIS — L821 Other seborrheic keratosis: Secondary | ICD-10-CM

## 2021-07-09 DIAGNOSIS — L820 Inflamed seborrheic keratosis: Secondary | ICD-10-CM | POA: Diagnosis not present

## 2021-07-09 NOTE — Progress Notes (Signed)
   Follow-Up Visit   Subjective  Laura Blackburn is a 51 y.o. female who presents for the following: Skin Problem. The patient has spots, moles and lesions to be evaluated, some may be new or changing and the patient has concerns that these could be cancer.    The following portions of the chart were reviewed this encounter and updated as appropriate:   Tobacco  Allergies  Meds  Problems  Med Hx  Surg Hx  Fam Hx      Review of Systems:  No other skin or systemic complaints except as noted in HPI or Assessment and Plan.  Objective  Well appearing patient in no apparent distress; mood and affect are within normal limits.  A focused examination was performed including back, left leg, right leg . Relevant physical exam findings are noted in the Assessment and Plan.  Right Upper Back  Right Upper Back Excoriated pink papule with surrounding information, slightly waxy appearance, exam limited by excoriation     Assessment & Plan  Inflamed seborrheic keratosis Right Upper Back  Favor ISK  Symptomatic, irritating, patient would like treated.   Recheck at follow-up. Call for changes before then.  Destruction of lesion - Right Upper Back Complexity: simple   Destruction method: cryotherapy   Informed consent: discussed and consent obtained   Timeout:  patient name, date of birth, surgical site, and procedure verified Lesion destroyed using liquid nitrogen: Yes   Region frozen until ice ball extended beyond lesion: Yes   Outcome: patient tolerated procedure well with no complications   Post-procedure details: wound care instructions given   Additional details:  Prior to procedure, discussed risks of blister formation, small wound, skin dyspigmentation, or rare scar following cryotherapy. Recommend Vaseline ointment to treated areas while healing.   Seborrheic Keratoses Left leg - Stuck-on, waxy, tan-brown papules and/or plaques  - Benign-appearing - Discussed benign  etiology and prognosis. - Observe - Call for any changes  Dermatofibroma Right ankle - Firm pink/brown papulenodule with dimple sign - Benign appearing - Call for any changes    Return in about 2 months (around 09/08/2021) for follow up, TBSE, hx of BCC.  I, Marye Round, CMA, am acting as scribe for Forest Gleason, MD .   Documentation: I have reviewed the above documentation for accuracy and completeness, and I agree with the above.  Forest Gleason, MD

## 2021-07-09 NOTE — Patient Instructions (Addendum)
Cryotherapy Aftercare  Wash gently with soap and water everyday.   Apply Vaseline and Band-Aid daily until healed.     Due to recent changes in healthcare laws, you may see results of your pathology and/or laboratory studies on MyChart before the doctors have had a chance to review them. We understand that in some cases there may be results that are confusing or concerning to you. Please understand that not all results are received at the same time and often the doctors may need to interpret multiple results in order to provide you with the best plan of care or course of treatment. Therefore, we ask that you please give us 2 business days to thoroughly review all your results before contacting the office for clarification. Should we see a critical lab result, you will be contacted sooner.   If You Need Anything After Your Visit  If you have any questions or concerns for your doctor, please call our main line at [phone] and press option 4 to reach your doctor's medical assistant. If no one answers, please leave a voicemail as directed and we will return your call as soon as possible. Messages left after 4 pm will be answered the following business day.   You may also send us a message via MyChart. We typically respond to MyChart messages within 1-2 business days.  For prescription refills, please ask your pharmacy to contact our office. Our fax number is [phone].  If you have an urgent issue when the clinic is closed that cannot wait until the next business day, you can page your doctor at the number below.    Please note that while we do our best to be available for urgent issues outside of office hours, we are not available 24/7.   If you have an urgent issue and are unable to reach us, you may choose to seek medical care at your doctor's office, retail clinic, urgent care center, or emergency room.  If you have a medical emergency, please immediately call 911 or go to the  emergency department.  Pager Numbers  - Dr. Kowalski: [phone]  - Dr. Joyce Leckey: [phone]  - Dr. Stewart: [phone]  In the event of inclement weather, please call our main line at [phone] for an update on the status of any delays or closures.  Dermatology Medication Tips: Please keep the boxes that topical medications come in in order to help keep track of the instructions about where and how to use these. Pharmacies typically print the medication instructions only on the boxes and not directly on the medication tubes.   If your medication is too expensive, please contact our office at [phone] option 4 or send us a message through MyChart.   We are unable to tell what your co-pay for medications will be in advance as this is different depending on your insurance coverage. However, we may be able to find a substitute medication at lower cost or fill out paperwork to get insurance to cover a needed medication.   If a prior authorization is required to get your medication covered by your insurance company, please allow us 1-2 business days to complete this process.  Drug prices often vary depending on where the prescription is filled and some pharmacies may offer cheaper prices.  The website www.goodrx.com contains coupons for medications through different pharmacies. The prices here do not account for what the cost may be with help from insurance (it may be cheaper with your insurance), but the website can   give you the price if you did not use any insurance.  - You can print the associated coupon and take it with your prescription to the pharmacy.  - You may also stop by our office during regular business hours and pick up a GoodRx coupon card.  - If you need your prescription sent electronically to a different pharmacy, notify our office through [facility] MyChart or by phone at [phone] option 4.     Si Usted Necesita Algo Despus de Su Visita  Tambin puede  enviarnos un mensaje a travs de MyChart. Por lo general respondemos a los mensajes de MyChart en el transcurso de 1 a 2 das hbiles.  Para renovar recetas, por favor pida a su farmacia que se ponga en contacto con nuestra oficina. Nuestro nmero de fax es el [phone].  Si tiene un asunto urgente cuando la clnica est cerrada y que no puede esperar hasta el siguiente da hbil, puede llamar/localizar a su doctor(a) al nmero que aparece a continuacin.   Por favor, tenga en cuenta que aunque hacemos todo lo posible para estar disponibles para asuntos urgentes fuera del horario de oficina, no estamos disponibles las 24 horas del da, los 7 das de la semana.   Si tiene un problema urgente y no puede comunicarse con nosotros, puede optar por buscar atencin mdica  en el consultorio de su doctor(a), en una clnica privada, en un centro de atencin urgente o en una sala de emergencias.  Si tiene una emergencia mdica, por favor llame inmediatamente al 911 o vaya a la sala de emergencias.  Nmeros de bper  - Dr. Kowalski: [phone]  - Dra. Aviah Sorci: [phone]  - Dra. Stewart: [phone]  En caso de inclemencias del tiempo, por favor llame a nuestra lnea principal al [phone] para una actualizacin sobre el estado de cualquier retraso o cierre.  Consejos para la medicacin en dermatologa: Por favor, guarde las cajas en las que vienen los medicamentos de uso tpico para ayudarle a seguir las instrucciones sobre dnde y cmo usarlos. Las farmacias generalmente imprimen las instrucciones del medicamento slo en las cajas y no directamente en los tubos del medicamento.   Si su medicamento es muy caro, por favor, pngase en contacto con nuestra oficina llamando al [phone] y presione la opcin 4 o envenos un mensaje a travs de MyChart.   No podemos decirle cul ser su copago por los medicamentos por adelantado ya que esto es diferente dependiendo de la cobertura de su seguro.  Sin embargo, es posible que podamos encontrar un medicamento sustituto a menor costo o llenar un formulario para que el seguro cubra el medicamento que se considera necesario.   Si se requiere una autorizacin previa para que su compaa de seguros cubra su medicamento, por favor permtanos de 1 a 2 das hbiles para completar este proceso.  Los precios de los medicamentos varan con frecuencia dependiendo del lugar de dnde se surte la receta y alguna farmacias pueden ofrecer precios ms baratos.  El sitio web www.goodrx.com tiene cupones para medicamentos de diferentes farmacias. Los precios aqu no tienen en cuenta lo que podra costar con la ayuda del seguro (puede ser ms barato con su seguro), pero el sitio web puede darle el precio si no utiliz ningn seguro.  - Puede imprimir el cupn correspondiente y llevarlo con su receta a la farmacia.  - Tambin puede pasar por nuestra oficina durante el horario de atencin regular y recoger una tarjeta de cupones de GoodRx.  -   Si necesita que su receta se enve electrnicamente a una farmacia diferente, informe a nuestra oficina a travs de MyChart de  o por telfono llamando al [phone] y presione la opcin 4.  

## 2021-07-16 ENCOUNTER — Encounter: Payer: Self-pay | Admitting: Dermatology

## 2021-09-12 ENCOUNTER — Ambulatory Visit: Payer: BC Managed Care – PPO | Admitting: Dermatology

## 2021-09-12 DIAGNOSIS — L814 Other melanin hyperpigmentation: Secondary | ICD-10-CM | POA: Diagnosis not present

## 2021-09-12 DIAGNOSIS — L503 Dermatographic urticaria: Secondary | ICD-10-CM | POA: Diagnosis not present

## 2021-09-12 DIAGNOSIS — Z1283 Encounter for screening for malignant neoplasm of skin: Secondary | ICD-10-CM | POA: Diagnosis not present

## 2021-09-12 DIAGNOSIS — L578 Other skin changes due to chronic exposure to nonionizing radiation: Secondary | ICD-10-CM

## 2021-09-12 DIAGNOSIS — L282 Other prurigo: Secondary | ICD-10-CM

## 2021-09-12 DIAGNOSIS — D2371 Other benign neoplasm of skin of right lower limb, including hip: Secondary | ICD-10-CM

## 2021-09-12 DIAGNOSIS — D1800 Hemangioma unspecified site: Secondary | ICD-10-CM

## 2021-09-12 DIAGNOSIS — L508 Other urticaria: Secondary | ICD-10-CM

## 2021-09-12 DIAGNOSIS — Z85828 Personal history of other malignant neoplasm of skin: Secondary | ICD-10-CM | POA: Diagnosis not present

## 2021-09-12 DIAGNOSIS — D229 Melanocytic nevi, unspecified: Secondary | ICD-10-CM

## 2021-09-12 DIAGNOSIS — L821 Other seborrheic keratosis: Secondary | ICD-10-CM

## 2021-09-12 MED ORDER — CLOBETASOL PROPIONATE 0.05 % EX CREA: 1.0000 | TOPICAL_CREAM | Freq: Two times a day (BID) | CUTANEOUS | 0 refills | Status: DC

## 2021-09-12 NOTE — Patient Instructions (Addendum)
Theodis Shove, M.D. [address], [place], [place] [address] [phone]  [email or web address]  Start clobetasol 0.05% cream twice daily for up to 2 weeks as needed for itch/bites. Avoid applying to face, groin, and axilla. Use as directed. Long-term use can cause thinning of the skin.  Topical steroids (such as triamcinolone, fluocinolone, fluocinonide, mometasone, clobetasol, halobetasol, betamethasone, hydrocortisone) can cause thinning and lightening of the skin if they are used for too long in the same area. Your physician has selected the right strength medicine for your problem and area affected on the body. Please use your medication only as directed by your physician to prevent side effects.   Recommend taking Heliocare sun protection supplement daily in sunny weather for additional sun protection. For maximum protection on the sunniest days, you can take up to 2 capsules of regular Heliocare OR take 1 capsule of Heliocare Ultra. For prolonged exposure (such as a full day in the sun), you can repeat your dose of the supplement 4 hours after your first dose. Heliocare can be purchased at [employer], at some [facility] or at [email or web address].    Melanoma ABCDEs  Melanoma is the most dangerous type of skin cancer, and is the leading cause of death from skin disease.  You are more likely to develop melanoma if you: Have light-colored skin, light-colored eyes, or red or blond hair Spend a lot of time in the sun Tan regularly, either outdoors or in a tanning bed Have had blistering sunburns, especially during childhood Have a close family member who has had a melanoma Have atypical moles or large birthmarks  Early detection of melanoma is key since treatment is typically straightforward and cure rates are extremely high if we catch it early.   The first sign of melanoma is often a change in a mole or a new dark spot.  The ABCDE system is a way of remembering  the signs of melanoma.  A for asymmetry:  The two halves do not match. B for border:  The edges of the growth are irregular. C for color:  A mixture of colors are present instead of an even brown color. D for diameter:  Melanomas are usually (but not always) greater than 47m - the size of a pencil eraser. E for evolution:  The spot keeps changing in size, shape, and color.  Please check your skin once per month between visits. You can use a small mirror in front and a large mirror behind you to keep an eye on the back side or your body.   If you see any new or changing lesions before your next follow-up, please call to schedule a visit.  Please continue daily skin protection including broad spectrum sunscreen SPF 30+ to sun-exposed areas, reapplying every 2 hours as needed when you're outdoors.    Due to recent changes in healthcare laws, you may see results of your pathology and/or laboratory studies on MyChart before the doctors have had a chance to review them. We understand that in some cases there may be results that are confusing or concerning to you. Please understand that not all results are received at the same time and often the doctors may need to interpret multiple results in order to provide you with the best plan of care or course of treatment. Therefore, we ask that you please give u[place]2 business days to thoroughly review all your results before contacting the office for clarification. Should we see a critical lab result, you  will be contacted sooner.   If You Need Anything After Your Visit  If you have any questions or concerns for your doctor, please call our main line at [phone] and press option 4 to reach your doctor's medical assistant. If no one answers, please leave a voicemail as directed and we will return your call as soon as possible. Messages left after 4 pm will be answered the following business day.   You may also send [place] a message via [place]. We typically  respond to MyChart messages within 1-2 business days.  For prescription refills, please ask your pharmacy to contact our office. Our fax number is [phone].  If you have an urgent issue when the clinic is closed that cannot wait until the next business day, you can page your doctor at the number below.    Please note that while we do our best to be available for urgent issues outside of office hours, we are not available 24/7.   If you have an urgent issue and are unable to reach [place], you may choose to seek medical care at your doctor's office, retail clinic, urgent care center, or emergency room.  If you have a medical emergency, please immediately call 911 or go to the emergency department.  Pager Numbers  - Dr. Nehemiah Massed: [phone]  - Dr. Laurence Ferrari: [phone]  - Dr. Nicole Kindred: [phone]  In the event of inclement weather, please call our main line at [phone] for an update on the status of any delays or closures.  Dermatology Medication Tips: Please keep the boxes that topical medications come in in order to help keep track of the instructions about where and how to use these. Pharmacies typically print the medication instructions only on the boxes and not directly on the medication tubes.   If your medication is too expensive, please contact our office at [phone] option 4 or send [place] a message through [place].   We are unable to tell what your co-pay for medications will be in advance as this is different depending on your insurance coverage. However, we may be able to find a substitute medication at lower cost or fill out paperwork to get insurance to cover a needed medication.   If a prior authorization is required to get your medication covered by your insurance company, please allow [place] 1-2 business days to complete this process.  Drug prices often vary depending on where the prescription is filled and some pharmacies may offer cheaper prices.  The website  www.goodrx.com contains coupons for medications through different pharmacies. The prices here do not account for what the cost may be with help from insurance (it may be cheaper with your insurance), but the website can give you the price if you did not use any insurance.  - You can print the associated coupon and take it with your prescription to the pharmacy.  - You may also stop by our office during regular business hours and pick up a GoodRx coupon card.  - If you need your prescription sent electronically to a different pharmacy, notify our office through [facility] or by phone at [phone] option 4.     Si Usted Necesita Algo Despus de Su Visita  Tambin puede enviarnos un mensaje a travs de Pharmacist, community. Por lo general respondemos a los mensajes de MyChart en el transcurso de 1 a 2 das hbiles.  Para renovar recetas, por favor pida a su farmacia que se ponga en contacto con nuestra oficina. Nuestro nmero de  fax es el [phone].  Si tiene un asunto urgente cuando la clnica est cerrada y que no puede esperar hasta el siguiente da hbil, puede llamar/localizar a su doctor(a) al nmero que aparece a continuacin.   Por favor, tenga en cuenta que aunque hacemos todo lo posible para estar disponibles para asuntos urgentes fuera del horario de [place], no estamos disponibles las 24 horas del da, los 7 das de la [place].   Si tiene un problema urgente y no puede comunicarse con nosotros, puede optar por buscar atencin mdica  en el consultorio de su doctor(a), en una clnica privada, en un centro de atencin urgente o en una sala de emergencias.  Si tiene Engineering geologist, por favor llame inmediatamente al 911 o vaya a la sala de emergencias.  Nmeros de bper  - Dr. Nehemiah Massed: [phone]  - Dra. Moye: [phone]  - Dra. Nicole Kindred: [phone]  En caso de inclemencias del [place], por favor llame a Johnsie Kindred principal al [phone] para una actualizacin  sobre el [place] de cualquier retraso o cierre.  Consejos para la medicacin en dermatologa: Por favor, guarde las cajas en las que vienen los medicamentos de uso tpico para ayudarle a seguir las instrucciones sobre dnde y cmo usarlos. Las farmacias generalmente imprimen las instrucciones del medicamento slo en las cajas y no directamente en los tubos del [place].   Si su medicamento es muy caro, por favor, pngase en contacto con Zigmund Daniel llamando al [phone] y presione la opcin 4 o envenos un mensaje a travs de Pharmacist, community.   No podemos decirle cul ser su copago por los medicamentos por adelantado ya que esto es diferente dependiendo de la cobertura de su seguro. Sin embargo, es posible que podamos encontrar un medicamento sustituto a Electrical engineer un formulario para que el seguro cubra el medicamento que se considera necesario.   Si se requiere una autorizacin previa para que su compaa de seguros [place] su medicamento, por favor permtanos de 1 a 2 das hbiles para completar este proceso.  Los precios de los medicamentos varan con frecuencia dependiendo del Environmental consultant de dnde se surte la receta y alguna farmacias pueden ofrecer precios ms baratos.  El sitio web www.goodrx.com tiene cupones para medicamentos de Airline pilot. Los precios aqu no tienen en cuenta lo que podra costar con la ayuda del seguro (puede ser ms barato con su seguro), pero el sitio web puede darle el precio si no utiliz Research scientist (physical sciences).  - Puede imprimir el cupn correspondiente y llevarlo con su receta a la farmacia.  - Tambin puede pasar por nuestra oficina durante el horario de atencin regular y Charity fundraiser una tarjeta de cupones de GoodRx.  - Si necesita que su receta se enve electrnicamente a una farmacia diferente, informe a nuestra oficina a travs de MyChart de [place] o por telfono llamando al [phone] y presione la opcin 4.

## 2021-09-12 NOTE — Progress Notes (Signed)
Follow-Up Visit   Subjective  Laura Blackburn is a 51 y.o. female who presents for the following: FBSE (The patient presents for Total-Body Skin Exam (TBSE) for skin cancer screening and mole check.  The patient has spots, moles and lesions to be evaluated, some may be new or changing and the patient has concerns that these could be cancer. Hx of BCC.).  Recheck ISK at right upper back.   She also gets very itchy at sites of bug bites.  The following portions of the chart were reviewed this encounter and updated as appropriate:   Tobacco  Allergies  Meds  Problems  Med Hx  Surg Hx  Fam Hx      Review of Systems:  No other skin or systemic complaints except as noted in HPI or Assessment and Plan.  Objective  Well appearing patient in no apparent distress; mood and affect are within normal limits.  A full examination was performed including scalp, head, eyes, ears, nose, lips, neck, chest, axillae, abdomen, back, buttocks, bilateral upper extremities, bilateral lower extremities, hands, feet, fingers, toes, fingernails, and toenails. All findings within normal limits unless otherwise noted below.  Right Ankle - Anterior Edematous pink papules    Assessment & Plan  Papular urticaria Right Ankle - Anterior  At site of insect bites. Chronic and persistent condition with duration or expected duration over one year. Condition is bothersome/symptomatic for patient. Currently flared.  Start clobetasol 0.05% cream twice daily for up to 2 weeks as needed for itch/bites. Avoid applying to face, groin, and axilla. Use as directed. Long-term use can cause thinning of the skin.  Topical steroids (such as triamcinolone, fluocinolone, fluocinonide, mometasone, clobetasol, halobetasol, betamethasone, hydrocortisone) can cause thinning and lightening of the skin if they are used for too long in the same area. Your physician has selected the right strength medicine for your problem and  area affected on the body. Please use your medication only as directed by your physician to prevent side effects.     clobetasol cream (TEMOVATE) 0.05 % - Right Ankle - Anterior Apply 1 Application topically 2 (two) times daily. For up to 2 weeks as needed for itch/bites. Avoid applying to face, groin, and axilla. Use as directed. Long-term use can cause thinning of the skin.   History of Basal Cell Carcinoma of the Skin - No evidence of recurrence today - Recommend regular full body skin exams - Recommend daily broad spectrum sunscreen SPF 30+ to sun-exposed areas, reapply every 2 hours as needed.  - Call if any new or changing lesions are noted between office visits  Lentigines - Scattered tan macules - Due to sun exposure - Benign-appearing, observe - Recommend daily broad spectrum sunscreen SPF 30+ to sun-exposed areas, reapply every 2 hours as needed. - Call for any changes  Seborrheic Keratoses - Stuck-on, waxy, tan-brown papules and/or plaques  - Benign-appearing - Discussed benign etiology and prognosis. - Observe - Call for any changes  Melanocytic Nevi - Tan-brown and/or pink-flesh-colored symmetric macules and papules - Benign appearing on exam today - Observation - Call clinic for new or changing moles - Recommend daily use of broad spectrum spf 30+ sunscreen to sun-exposed areas.   Hemangiomas - Red papules - Discussed benign nature - Observe - Call for any changes  Actinic Damage - Chronic condition, secondary to cumulative UV/sun exposure - diffuse scaly erythematous macules with underlying dyspigmentation - Recommend daily broad spectrum sunscreen SPF 30+ to sun-exposed areas, reapply every 2 hours  as needed.  - Staying in the shade or wearing long sleeves, sun glasses (UVA+UVB protection) and wide brim hats (4-inch brim around the entire circumference of the hat) are also recommended for sun protection.  - Call for new or changing lesions.  Skin cancer  screening performed today.  Dermatofibroma - Firm pink/brown papulenodule with dimple sign at right ankle - Benign appearing - Call for any changes  Return in about 1 year (around 09/13/2022) for TBSE, Hx BCC.  Graciella Belton, RMA, am acting as scribe for Forest Gleason, MD .  Documentation: I have reviewed the above documentation for accuracy and completeness, and I agree with the above.  Forest Gleason, MD

## 2021-09-25 ENCOUNTER — Encounter: Payer: Self-pay | Admitting: Dermatology

## 2022-01-05 ENCOUNTER — Emergency Department: Payer: BC Managed Care – PPO

## 2022-01-05 ENCOUNTER — Other Ambulatory Visit: Payer: Self-pay

## 2022-01-05 ENCOUNTER — Emergency Department
Admission: EM | Admit: 2022-01-05 | Discharge: 2022-01-05 | Disposition: A | Discharge status: Home / Self Care | Payer: BC Managed Care – PPO | Attending: Emergency Medicine | Admitting: Emergency Medicine

## 2022-01-05 ENCOUNTER — Encounter: Payer: Self-pay | Admitting: Emergency Medicine

## 2022-01-05 DIAGNOSIS — I10 Essential (primary) hypertension: Secondary | ICD-10-CM | POA: Diagnosis not present

## 2022-01-05 DIAGNOSIS — M5441 Lumbago with sciatica, right side: Secondary | ICD-10-CM | POA: Insufficient documentation

## 2022-01-05 DIAGNOSIS — M533 Sacrococcygeal disorders, not elsewhere classified: Secondary | ICD-10-CM | POA: Insufficient documentation

## 2022-01-05 DIAGNOSIS — M79604 Pain in right leg: Secondary | ICD-10-CM | POA: Diagnosis present

## 2022-01-05 MED ORDER — HYDROCODONE-ACETAMINOPHEN 5-325 MG PO TABS
1.0000 | ORAL_TABLET | Freq: Once | ORAL | Status: AC
  Administered 2022-01-05: 1 via ORAL
  Filled 2022-01-05: qty 1

## 2022-01-05 MED ORDER — METHOCARBAMOL 500 MG PO TABS: 500.0000 mg | ORAL_TABLET | Freq: Four times a day (QID) | ORAL | 0 refills | Status: DC | PRN

## 2022-01-05 MED ORDER — PREDNISONE 10 MG PO TABS: ORAL_TABLET | ORAL | 0 refills | Status: DC

## 2022-01-05 MED ORDER — KETOROLAC TROMETHAMINE 30 MG/ML IJ SOLN
30.0000 mg | Freq: Once | INTRAMUSCULAR | Status: AC
  Administered 2022-01-05: 30 mg via INTRAMUSCULAR
  Filled 2022-01-05: qty 1

## 2022-01-05 MED ORDER — HYDROCODONE-ACETAMINOPHEN 5-325 MG PO TABS: 1.0000 | ORAL_TABLET | Freq: Four times a day (QID) | ORAL | 0 refills | Status: DC | PRN

## 2022-01-05 NOTE — ED Triage Notes (Signed)
Pt states pain to R leg. Pt states pain started at tailbone x 2 days. Pt states pain shoots down her R thigh, worse with sitting. Pt states pain radiates through her buttocks down her posterior thigh.

## 2022-01-05 NOTE — ED Provider Notes (Signed)
[facility] Provider Note    None    (approximate)   History   Leg Pain   HPI  Laura Blackburn is a 51 y.o. female   presents with pain that starts at her tailbone for the last 2 days and shoots down into her right thigh.  Patient states that sitting makes it worse.  She also reports that the pain radiates through her buttocks down to her thigh.  She denies any falls or injuries to this area.  Over-the-counter medications have not worked.  Patient has a history of hypertension, kidney stone, GERD, depression.      Physical Exam   Triage Vital Signs: ED Triage Vitals  Enc Vitals Group     BP 01/05/22 0253 136/71     Pulse Rate 01/05/22 0253 90     Resp 01/05/22 0253 20     Temp 01/05/22 0253 98.2 F (36.8 C)     Temp Source 01/05/22 0253 Oral     SpO2 01/05/22 0253 98 %     Weight 01/05/22 0253 147 lb (66.7 kg)     Height 01/05/22 0253 '5\' 4"'$  (1.626 m)     Head Circumference --      Peak Flow --      Pain Score 01/05/22 0252 10     Pain Loc --      Pain Edu? --      Excl. in [place]? --     Most recent vital signs: Vitals:   01/05/22 0253 01/05/22 0823  BP: 136/71 (!) 129/48  Pulse: 90 78  Resp: 20 18  Temp: 98.2 F (36.8 C) 98.4 F (36.9 C)  SpO2: 98% 99%     General: Awake, no distress.  Appears to be uncomfortable. CV:  Good peripheral perfusion.  Heart regular rate and rhythm. Resp:  Normal effort.  Clear bilaterally. Abd:  No distention.  Soft, nontender. Other:  On palpation of the lumbar and sacral area there is no step-offs or point tenderness on palpation of the vertebral bodies.  There is tenderness to the right SI joint and surrounding muscle tissue.  Range of motion is decreased secondary to increased pain.  Good muscle strength bilaterally at 5/5.  Reflexes are 2+ bilaterally.  Straight leg raises were approximately 40 degrees.   ED Results / Procedures / Treatments   Labs (all labs ordered are listed, but only  abnormal results are displayed) Labs Reviewed - No data to display    RADIOLOGY Lumbar spine x-ray images were reviewed by myself independent of the radiologist and no compression fracture noted.  Radiology report shows mild degenerative changes at multiple levels.    PROCEDURES:  Critical Care performed:   Procedures   MEDICATIONS ORDERED IN ED: Medications  ketorolac (TORADOL) 30 MG/ML injection 30 mg (30 mg Intramuscular Given 01/05/22 0819)  HYDROcodone-acetaminophen (NORCO/VICODIN) 5-325 MG per tablet 1 tablet (1 tablet Oral Given 01/05/22 0820)     IMPRESSION / MDM / ASSESSMENT AND PLAN / ED COURSE  I reviewed the triage vital signs and the nursing notes.   Differential diagnosis includes, but is not limited to, low back pain with radiculopathy, sciatica, compression fracture, degenerative disc disease, no symptoms suggestive of cauda equina.  52 year old female presents to the ED with complaint of right-sided low back pain with radiation into her right hip and down into her thigh.  No recent injury reported.  Patient denies any previous injury to her back.  No symptoms suggestive of  cauda equina.  She was given hydrocodone and Toradol to milligrams IM while in the ED.  X-rays showed mild multilevel degenerative changes and patient was reassured.  She is encouraged to follow-up with her PCP if any continued problems.  A prescription for hydrocodone, methocarbamol and prednisone was sent to her pharmacy to take as directed.  She may also use heat or ice to her back as needed.  Return precautions were given to the patient.      Patient's presentation is most consistent with acute complicated illness / injury requiring diagnostic workup.  FINAL CLINICAL IMPRESSION(S) / ED DIAGNOSES   Final diagnoses:  Acute midline low back pain with right-sided sciatica     Rx / DC Orders   ED Discharge Orders          Ordered    HYDROcodone-acetaminophen (NORCO/VICODIN) 5-325 MG  tablet  Every 6 hours PRN        01/05/22 0917    methocarbamol (ROBAXIN) 500 MG tablet  Every 6 hours PRN        01/05/22 0917    predniSONE (DELTASONE) 10 MG tablet        01/05/22 9233             Note:  This document was prepared using Dragon voice recognition software and may include unintentional dictation errors.   Johnn Hai, PA-C 01/05/22 1343    Nena Polio, MD 01/05/22 [id number]

## 2022-01-05 NOTE — Discharge Instructions (Signed)
Follow-up with your primary care provider if any continued problems or concerns.  A prescription for a muscle relaxant was sent to the pharmacy to take every 6 hours as needed for muscle spasms, hydrocodone every 6 hours as needed for moderate to severe pain and prednisone to begin taking today and tapering down over the next 6 days x 1 tablet.  Be aware that the muscle relaxant and pain medication together could increase drowsiness and also increase your chances for injury.  Do not drive or operate machinery while taking this medication.  You may also use ice or heat to your back as needed for discomfort.  When lying in bed put 2 pillows under your knees which will help with back discomfort and if you sleep on your side put 1 pillow between your knees.

## 2022-01-10 ENCOUNTER — Ambulatory Visit
Admission: RE | Admit: 2022-01-10 | Discharge: 2022-01-10 | Disposition: A | Discharge status: Home / Self Care | Payer: BC Managed Care – PPO | Source: Ambulatory Visit | Attending: Orthopedic Surgery | Admitting: Orthopedic Surgery

## 2022-01-10 ENCOUNTER — Other Ambulatory Visit: Payer: Self-pay | Admitting: Orthopedic Surgery

## 2022-01-10 DIAGNOSIS — M5416 Radiculopathy, lumbar region: Secondary | ICD-10-CM

## 2022-01-22 ENCOUNTER — Other Ambulatory Visit: Payer: Self-pay | Admitting: Orthopedic Surgery

## 2022-01-31 NOTE — Pre-Procedure Instructions (Signed)
Surgical Instructions    Your procedure is scheduled on Thursday, January 25th.  Report to [facility] Main Entrance "A" at 05:30 A.M., then check in with the Admitting office.  Call this number if you have problems the morning of surgery:  [phone]  If you have any questions prior to your surgery date call [phone]: Open Monday-Friday 8am-4pm If you experience any cold or flu symptoms such as cough, fever, chills, shortness of breath, etc. between now and your scheduled surgery, please notify us at the above number.     Remember:  Do not eat after midnight the night before your surgery  You may drink clear liquids until 04:30 AM the morning of your surgery.   Clear liquids allowed are: Water, Non-Citrus Juices (without pulp), Carbonated Beverages, Clear Tea, Black Coffee Only (NO MILK, CREAM OR POWDERED CREAMER of any kind), and Gatorade.   Patient Instructions  The night before surgery:  No food after midnight. ONLY clear liquids after midnight  The day of surgery (if you do NOT have diabetes):  Drink ONE (1) Pre-Surgery Clear Ensure by 04:30 AM the morning of surgery. Drink in one sitting. Do not sip.  This drink was given to you during your hospital  pre-op appointment visit.  Nothing else to drink after completing the  Pre-Surgery Clear Ensure.          If you have questions, please contact your surgeon's office.     Take these medicines the morning of surgery with A SIP OF WATER  gabapentin (NEURONTIN)    If needed: methocarbamol (ROBAXIN)  traMADol (ULTRAM)    As of today, STOP taking any Aspirin (unless otherwise instructed by your surgeon) Aleve, Naproxen, Ibuprofen, Motrin, Advil, Goody's, BC's, all herbal medications, fish oil, and all vitamins.                     Do NOT Smoke (Tobacco/Vaping) for 24 hours prior to your procedure.  If you use a CPAP at night, you may bring your mask/headgear for your overnight stay.   Contacts, glasses,  piercing's, hearing aid's, dentures or partials may not be worn into surgery, please bring cases for these belongings.    For patients admitted to the hospital, discharge time will be determined by your treatment team.   Patients discharged the day of surgery will not be allowed to drive home, and someone needs to stay with them for 24 hours.  SURGICAL WAITING ROOM VISITATION Patients having surgery or a procedure may have no more than 2 support people in the waiting area - these visitors may rotate.   Children under the age of 5 must have an adult with them who is not the patient. If the patient needs to stay at the hospital during part of their recovery, the visitor guidelines for inpatient rooms apply. Pre-op nurse will coordinate an appropriate time for 1 support person to accompany patient in pre-op.  This support person may not rotate.   Please refer to the [facility] website for the visitor guidelines for Inpatients (after your surgery is over and you are in a regular room).    Special instructions:   [facility]- Preparing For Surgery  Before surgery, you can play an important role. Because skin is not sterile, your skin needs to be as free of germs as possible. You can reduce the number of germs on your skin by washing with CHG (chlorahexidine gluconate) Soap before surgery.  CHG is an antiseptic cleaner which kills  germs and bonds with the skin to continue killing germs even after washing.    Oral Hygiene is also important to reduce your risk of infection.  Remember - BRUSH YOUR TEETH THE MORNING OF SURGERY WITH YOUR REGULAR TOOTHPASTE  Please do not use if you have an allergy to CHG or antibacterial soaps. If your skin becomes reddened/irritated stop using the CHG.  Do not shave (including legs and underarms) for at least 48 hours prior to first CHG shower. It is OK to shave your face.  Please follow these instructions carefully.   Shower the NIGHT BEFORE SURGERY and the  MORNING OF SURGERY  If you chose to wash your hair, wash your hair first as usual with your normal shampoo.  After you shampoo, rinse your hair and body thoroughly to remove the shampoo.  Use CHG Soap as you would any other liquid soap. You can apply CHG directly to the skin and wash gently with a scrungie or a clean washcloth.   Apply the CHG Soap to your body ONLY FROM THE NECK DOWN.  Do not use on open wounds or open sores. Avoid contact with your eyes, ears, mouth and genitals (private parts). Wash Face and genitals (private parts)  with your normal soap.   Wash thoroughly, paying special attention to the area where your surgery will be performed.  Thoroughly rinse your body with warm water from the neck down.  DO NOT shower/wash with your normal soap after using and rinsing off the CHG Soap.  Pat yourself dry with a CLEAN TOWEL.  Wear CLEAN PAJAMAS to bed the night before surgery  Place CLEAN SHEETS on your bed the night before your surgery  DO NOT SLEEP WITH PETS.   Day of Surgery: Take a shower with CHG soap. Do not wear jewelry or makeup Do not wear lotions, powders, perfumes, or deodorant. Do not shave 48 hours prior to surgery.   Do not bring valuables to the hospital. [facility] is not responsible for any belongings or valuables. Do not wear nail polish, gel polish, artificial nails, or any other type of covering on natural nails (fingers and toes) If you have artificial nails or gel coating that need to be removed by a nail salon, please have this removed prior to surgery. Artificial nails or gel coating may interfere with anesthesia's ability to adequately monitor your vital signs. Wear Clean/Comfortable clothing the morning of surgery Remember to brush your teeth WITH YOUR REGULAR TOOTHPASTE.   Please read over the following fact sheets that you were given.    If you received a COVID test during your pre-op visit  it is requested that you wear a mask when out in  public, stay away from anyone that may not be feeling well and notify your surgeon if you develop symptoms. If you have been in contact with anyone that has tested positive in the last 10 days please notify you surgeon.

## 2022-02-03 ENCOUNTER — Other Ambulatory Visit: Payer: Self-pay

## 2022-02-03 ENCOUNTER — Encounter (HOSPITAL_COMMUNITY): Payer: Self-pay

## 2022-02-03 ENCOUNTER — Encounter (HOSPITAL_COMMUNITY)
Admission: RE | Admit: 2022-02-03 | Discharge: 2022-02-03 | Disposition: A | Discharge status: Home / Self Care | Payer: No Typology Code available for payment source | Source: Ambulatory Visit | Attending: Orthopedic Surgery | Admitting: Orthopedic Surgery

## 2022-02-03 VITALS — BP 118/64 | HR 103 | Temp 98.4°F | Resp 16 | Ht 64.0 in | Wt 145.0 lb

## 2022-02-03 DIAGNOSIS — Z85828 Personal history of other malignant neoplasm of skin: Secondary | ICD-10-CM | POA: Diagnosis not present

## 2022-02-03 DIAGNOSIS — M79604 Pain in right leg: Secondary | ICD-10-CM | POA: Insufficient documentation

## 2022-02-03 DIAGNOSIS — I10 Essential (primary) hypertension: Secondary | ICD-10-CM | POA: Diagnosis not present

## 2022-02-03 DIAGNOSIS — D509 Iron deficiency anemia, unspecified: Secondary | ICD-10-CM | POA: Insufficient documentation

## 2022-02-03 DIAGNOSIS — Z01818 Encounter for other preprocedural examination: Secondary | ICD-10-CM | POA: Diagnosis present

## 2022-02-03 DIAGNOSIS — K219 Gastro-esophageal reflux disease without esophagitis: Secondary | ICD-10-CM | POA: Diagnosis not present

## 2022-02-03 DIAGNOSIS — R531 Weakness: Secondary | ICD-10-CM | POA: Insufficient documentation

## 2022-02-03 HISTORY — DX: Unspecified osteoarthritis, unspecified site: M19.90

## 2022-02-03 HISTORY — DX: Other specified postprocedural states: R11.2

## 2022-02-03 HISTORY — DX: Other specified postprocedural states: Z98.890

## 2022-02-03 HISTORY — DX: Family history of other specified conditions: Z84.89

## 2022-02-03 LAB — CBC
HCT: 39.4 % (ref 36.0–46.0)
Hemoglobin: 12.9 g/dL (ref 12.0–15.0)
MCH: 30.9 pg (ref 26.0–34.0)
MCHC: 32.7 g/dL (ref 30.0–36.0)
MCV: 94.3 fL (ref 80.0–100.0)
Platelets: 196 10*3/uL (ref 150–400)
RBC: 4.18 MIL/uL (ref 3.87–5.11)
RDW: 11.9 % (ref 11.5–15.5)
WBC: 4.8 10*3/uL (ref 4.0–10.5)
nRBC: 0 % (ref 0.0–0.2)

## 2022-02-03 LAB — BASIC METABOLIC PANEL
Anion gap: 8 (ref 5–15)
BUN: 15 mg/dL (ref 6–20)
CO2: 28 mmol/L (ref 22–32)
Calcium: 9 mg/dL (ref 8.9–10.3)
Chloride: 105 mmol/L (ref 98–111)
Creatinine, Ser: 0.95 mg/dL (ref 0.44–1.00)
GFR, Estimated: >60 mL/min (ref >60)
Glucose, Bld: 84 mg/dL (ref 70–99)
Potassium: 3.6 mmol/L (ref 3.5–5.1)
Sodium: 141 mmol/L (ref 135–145)

## 2022-02-03 LAB — SURGICAL PCR SCREEN
MRSA, PCR: NEGATIVE
Staphylococcus aureus: NEGATIVE

## 2022-02-03 NOTE — Progress Notes (Signed)
PCP - Dionicia Abler, PA-C Cardiologist - denies  PPM/ICD - denies   Chest x-ray - 04/14/2005 EKG - 02/03/22 Stress Test - 2004 per pt, records requested ECHO - 2004 per pt, records requested Cardiac Cath - denies  Sleep Study - denies   DM- denies    ASA/Blood Thinner Instructions: n/a   ERAS Protcol - yes PRE-SURGERY Ensure given at PAT  COVID TEST- n/a   Anesthesia review: yes, records requested  Patient denies shortness of breath, fever, cough and chest pain at PAT appointment   All instructions explained to the patient, with a verbal understanding of the material. Patient agrees to go over the instructions while at home for a better understanding. The opportunity to ask questions was provided.

## 2022-02-04 NOTE — Progress Notes (Signed)
Anesthesia Chart Review:  Case: [id number] Date/Time: 02/06/22 0715   Procedure: RIGHT-SIDED LUMBAR 5 - SACRUM 1 MICRODISECTOMY (Right)   Anesthesia type: General   Pre-op diagnosis: Right S1 radiculopathy secondary to an extruded right-sided L5-S1 disc herniation, compressing the right S1 nerve.  This is resulting in significant constant right leg pain and weakness.  She does lack and Achilles reflex on the right.   Location: [place] [facility] [place] OR   Surgeons: Phylliss Bob, MD       DISCUSSION: Patient is a 52 year old female scheduled for the above procedure.  History includes never smoker, post-operative N/V, HTN, GERD, iron deficiency anemia, skin cancer (BCC), umbilical hernia repair 0/1/09, rotator cuff repair (2020), mandibular fracture surgery (1987).   She denied chest pain and SOB at PAT RN visit. 02/03/22 EKG showed NSR. Remote history of prior stress test and echo ~ 2004.   Anesthesia team to evaluate on the day of surgery.    VS: BP 118/64   Pulse (!) 103   Temp 36.9 C   Resp 16   Ht '5\' 4"'$  (1.626 m)   Wt 65.8 kg   LMP 11/23/2010   SpO2 100%   BMI 24.89 kg/m   PROVIDERS: Dionicia Abler, PA-C is PCP ([facility])   LABS: Labs reviewed: Acceptable for surgery. (all labs ordered are listed, but only abnormal results are displayed)  Labs Reviewed  SURGICAL PCR SCREEN  BASIC METABOLIC PANEL  CBC    EKG: 02/03/22: NSR   CV: She reported a prior stress test and echocardiogram 10 years ago (~ 2004).    Past Medical History:  Diagnosis Date   Anemia    iron deficiency   Arthritis    Basal cell carcinoma 11/19/2016   right ant shoulder/superficial   Basal cell carcinoma 12/24/2016   left upper back/excision   Chicken pox    Depression 2010   Postpartum, pt no longer feels depression   Ectopic pregnancy 2008   Family history of adverse reaction to anesthesia    Mom has hx of PONV   GERD (gastroesophageal reflux disease)    History of kidney  stones    Hypertension    PONV (postoperative nausea and vomiting)     Past Surgical History:  Procedure Laterality Date   BTL reversal  2006   CESAREAN SECTION  2007   CESAREAN SECTION  2010   COLONOSCOPY WITH PROPOFOL N/A 03/16/2020   Procedure: COLONOSCOPY WITH PROPOFOL;  Surgeon: Virgel Manifold, MD;  Location: [facility] ENDOSCOPY;  Service: Endoscopy;  Laterality: N/A;   LAPAROSCOPY FOR ECTOPIC PREGNANCY  08/2006   left side   LITHOTRIPSY  2004   for kidney stones   MANDIBLE FRACTURE SURGERY  1987   miscarriage  09/2006   D&C   ROTATOR CUFF REPAIR Right 2020   TONSILLECTOMY  1979   TUBAL LIGATION  1995   BTL   TYMPANOSTOMY TUBE PLACEMENT     2 sets of ear tubes   UMBILICAL HERNIA REPAIR  2011    MEDICATIONS:  Ascorbic Acid (VITAMIN C) 1000 MG tablet   clobetasol cream (TEMOVATE) 0.05 %   escitalopram (LEXAPRO) 10 MG tablet   gabapentin (NEURONTIN) 300 MG capsule   HYDROcodone-acetaminophen (NORCO/VICODIN) 5-325 MG tablet   methocarbamol (ROBAXIN) 500 MG tablet   Multiple Vitamin (MULTIVITAMIN) tablet   predniSONE (DELTASONE) 10 MG tablet   traMADol (ULTRAM) 50 MG tablet   No current facility-administered medications for this encounter.   By current  medication list, she is not taking Temovate, Lexapro, Neurontin, Norco, or prednisone.   Myra Gianotti, PA-C Surgical Short Stay/Anesthesiology [facility] Phone [phone] [facility] Phone [phone] 02/04/2022 2:54 PM

## 2022-02-04 NOTE — Anesthesia Preprocedure Evaluation (Signed)
Anesthesia Evaluation  Patient identified by MRN, date of birth, ID band Patient awake  General Assessment Comment:Hx/o "heart stopped" during ESWL 19 years ago  Reviewed: Allergy & Precautions, NPO status , Patient's Chart, lab work & pertinent test results  History of Anesthesia Complications (+) PONV, Family history of anesthesia reaction and history of anesthetic complications  Airway Mallampati: II       Dental no notable dental hx. (+) Teeth Intact   Pulmonary neg pulmonary ROS   Pulmonary exam normal breath sounds clear to auscultation       Cardiovascular hypertension, Pt. on medications Normal cardiovascular exam Rhythm:Regular Rate:Normal     Neuro/Psych  Headaches PSYCHIATRIC DISORDERS Anxiety Depression       GI/Hepatic Neg liver ROS,GERD  ,,  Endo/Other  negative endocrine ROS    Renal/GU Hx/o renal calculi  negative genitourinary   Musculoskeletal  (+) Arthritis , Osteoarthritis,  L5-S1 Radiculopathy   Abdominal   Peds  Hematology  (+) Blood dyscrasia, anemia   Anesthesia Other Findings   Reproductive/Obstetrics                              Anesthesia Physical Anesthesia Plan  ASA: 2  Anesthesia Plan: General   Post-op Pain Management: Dilaudid IV, Precedex and Ketamine IV*   Induction: Intravenous  PONV Risk Score and Plan: 4 or greater and Treatment may vary due to age or medical condition, Midazolam, Scopolamine patch - Pre-op, Diphenhydramine, Dexamethasone and Ondansetron  Airway Management Planned: Oral ETT  Additional Equipment: None  Intra-op Plan:   Post-operative Plan: Extubation in OR  Informed Consent: I have reviewed the patients History and Physical, chart, labs and discussed the procedure including the risks, benefits and alternatives for the proposed anesthesia with the patient or authorized representative who has indicated his/her understanding  and acceptance.       Plan Discussed with: CRNA and Anesthesiologist  Anesthesia Plan Comments: (PAT note written 02/04/2022 by Myra Gianotti, PA-C.  )        Anesthesia Quick Evaluation

## 2022-02-06 ENCOUNTER — Ambulatory Visit (HOSPITAL_COMMUNITY): Payer: No Typology Code available for payment source | Admitting: Vascular Surgery

## 2022-02-06 ENCOUNTER — Ambulatory Visit (HOSPITAL_COMMUNITY): Payer: No Typology Code available for payment source

## 2022-02-06 ENCOUNTER — Other Ambulatory Visit: Payer: Self-pay

## 2022-02-06 ENCOUNTER — Ambulatory Visit (HOSPITAL_BASED_OUTPATIENT_CLINIC_OR_DEPARTMENT_OTHER): Payer: No Typology Code available for payment source | Admitting: Anesthesiology

## 2022-02-06 ENCOUNTER — Encounter (HOSPITAL_COMMUNITY): Payer: Self-pay | Admitting: Orthopedic Surgery

## 2022-02-06 ENCOUNTER — Ambulatory Visit (HOSPITAL_COMMUNITY)
Admission: RE | Admit: 2022-02-06 | Discharge: 2022-02-06 | Disposition: A | Discharge status: Home / Self Care | Payer: No Typology Code available for payment source | Source: Ambulatory Visit | Attending: Orthopedic Surgery | Admitting: Orthopedic Surgery

## 2022-02-06 ENCOUNTER — Encounter (HOSPITAL_COMMUNITY)
Admission: RE | Disposition: A | Discharge status: Home / Self Care | Payer: Self-pay | Source: Ambulatory Visit | Attending: Orthopedic Surgery

## 2022-02-06 DIAGNOSIS — K219 Gastro-esophageal reflux disease without esophagitis: Secondary | ICD-10-CM | POA: Insufficient documentation

## 2022-02-06 DIAGNOSIS — I10 Essential (primary) hypertension: Secondary | ICD-10-CM | POA: Diagnosis not present

## 2022-02-06 DIAGNOSIS — M5117 Intervertebral disc disorders with radiculopathy, lumbosacral region: Secondary | ICD-10-CM | POA: Diagnosis present

## 2022-02-06 DIAGNOSIS — M5416 Radiculopathy, lumbar region: Secondary | ICD-10-CM

## 2022-02-06 HISTORY — PX: LUMBAR LAMINECTOMY/DECOMPRESSION MICRODISCECTOMY: SHX5026

## 2022-02-06 SURGERY — LUMBAR LAMINECTOMY/DECOMPRESSION MICRODISCECTOMY
Anesthesia: General | Site: Spine Lumbar | Laterality: Right

## 2022-02-06 MED ORDER — PHENYLEPHRINE 80 MCG/ML (10ML) SYRINGE FOR IV PUSH (FOR BLOOD PRESSURE SUPPORT)
PREFILLED_SYRINGE | INTRAVENOUS | Status: DC | PRN
  Administered 2022-02-06: 80 ug via INTRAVENOUS
  Administered 2022-02-06 (×2): 160 ug via INTRAVENOUS
  Administered 2022-02-06 (×2): 80 ug via INTRAVENOUS
  Administered 2022-02-06 (×2): 160 ug via INTRAVENOUS

## 2022-02-06 MED ORDER — HYDROCODONE-ACETAMINOPHEN 5-325 MG PO TABS: 1.0000 | ORAL_TABLET | Freq: Four times a day (QID) | ORAL | 0 refills | Status: DC | PRN

## 2022-02-06 MED ORDER — PROPOFOL 10 MG/ML IV BOLUS
INTRAVENOUS | Status: AC
  Filled 2022-02-06: qty 20

## 2022-02-06 MED ORDER — BUPIVACAINE-EPINEPHRINE 0.25% -1:200000 IJ SOLN
INTRAMUSCULAR | Status: DC | PRN
  Administered 2022-02-06: 6 mL

## 2022-02-06 MED ORDER — ORAL CARE MOUTH RINSE: 15.0000 mL | Freq: Once | OROMUCOSAL | Status: AC

## 2022-02-06 MED ORDER — 0.9 % SODIUM CHLORIDE (POUR BTL) OPTIME
TOPICAL | Status: DC | PRN
  Administered 2022-02-06: 1000 mL

## 2022-02-06 MED ORDER — SCOPOLAMINE 1 MG/3DAYS TD PT72
MEDICATED_PATCH | TRANSDERMAL | Status: AC
  Filled 2022-02-06: qty 1

## 2022-02-06 MED ORDER — POVIDONE-IODINE 7.5 % EX SOLN
Freq: Once | CUTANEOUS | Status: DC
  Filled 2022-02-06: qty 118

## 2022-02-06 MED ORDER — BUPIVACAINE LIPOSOME 1.3 % IJ SUSP: INTRAMUSCULAR | Status: DC | PRN

## 2022-02-06 MED ORDER — ONDANSETRON HCL 4 MG/2ML IJ SOLN
INTRAMUSCULAR | Status: DC | PRN
  Administered 2022-02-06: 4 mg via INTRAVENOUS

## 2022-02-06 MED ORDER — LIDOCAINE 2% (20 MG/ML) 5 ML SYRINGE
INTRAMUSCULAR | Status: DC | PRN
  Administered 2022-02-06: 80 mg via INTRAVENOUS

## 2022-02-06 MED ORDER — THROMBIN 20000 UNITS EX SOLR: CUTANEOUS | Status: DC | PRN

## 2022-02-06 MED ORDER — THROMBIN 20000 UNITS EX SOLR
CUTANEOUS | Status: AC
  Filled 2022-02-06: qty 20000

## 2022-02-06 MED ORDER — EPHEDRINE 5 MG/ML INJ
INTRAVENOUS | Status: AC
  Filled 2022-02-06: qty 5

## 2022-02-06 MED ORDER — FENTANYL CITRATE (PF) 250 MCG/5ML IJ SOLN
INTRAMUSCULAR | Status: DC | PRN
  Administered 2022-02-06: 100 ug via INTRAVENOUS
  Administered 2022-02-06: 50 ug via INTRAVENOUS

## 2022-02-06 MED ORDER — DEXAMETHASONE SODIUM PHOSPHATE 10 MG/ML IJ SOLN
INTRAMUSCULAR | Status: DC | PRN
  Administered 2022-02-06: 10 mg via INTRAVENOUS

## 2022-02-06 MED ORDER — KETAMINE HCL 50 MG/ML IJ SOLN
INTRAMUSCULAR | Status: DC | PRN
  Administered 2022-02-06: 20 mg via INTRAMUSCULAR

## 2022-02-06 MED ORDER — METHYLPREDNISOLONE ACETATE 40 MG/ML IJ SUSP
INTRAMUSCULAR | Status: AC
  Filled 2022-02-06: qty 1

## 2022-02-06 MED ORDER — HYDROMORPHONE HCL 1 MG/ML IJ SOLN
0.2500 mg | INTRAMUSCULAR | Status: DC | PRN
  Administered 2022-02-06: 0.5 mg via INTRAVENOUS

## 2022-02-06 MED ORDER — BUPIVACAINE-EPINEPHRINE (PF) 0.25% -1:200000 IJ SOLN
INTRAMUSCULAR | Status: AC
  Filled 2022-02-06: qty 30

## 2022-02-06 MED ORDER — LIDOCAINE 2% (20 MG/ML) 5 ML SYRINGE
INTRAMUSCULAR | Status: AC
  Filled 2022-02-06: qty 5

## 2022-02-06 MED ORDER — DIPHENHYDRAMINE HCL 50 MG/ML IJ SOLN
INTRAMUSCULAR | Status: AC
  Filled 2022-02-06: qty 1

## 2022-02-06 MED ORDER — SUGAMMADEX SODIUM 200 MG/2ML IV SOLN
INTRAVENOUS | Status: DC | PRN
  Administered 2022-02-06: 200 mg via INTRAVENOUS

## 2022-02-06 MED ORDER — ONDANSETRON HCL 4 MG/2ML IJ SOLN
INTRAMUSCULAR | Status: AC
  Filled 2022-02-06: qty 2

## 2022-02-06 MED ORDER — METHOCARBAMOL 750 MG PO TABS: 750.0000 mg | ORAL_TABLET | Freq: Four times a day (QID) | ORAL | 0 refills | Status: DC | PRN

## 2022-02-06 MED ORDER — SURGIFLO WITH THROMBIN (HEMOSTATIC MATRIX KIT) OPTIME
TOPICAL | Status: DC | PRN
  Administered 2022-02-06: 1 via TOPICAL

## 2022-02-06 MED ORDER — HYDROMORPHONE HCL 1 MG/ML IJ SOLN
INTRAMUSCULAR | Status: AC
  Filled 2022-02-06: qty 1

## 2022-02-06 MED ORDER — LACTATED RINGERS IV SOLN: INTRAVENOUS | Status: DC

## 2022-02-06 MED ORDER — KETAMINE HCL 50 MG/5ML IJ SOSY
PREFILLED_SYRINGE | INTRAMUSCULAR | Status: AC
  Filled 2022-02-06: qty 5

## 2022-02-06 MED ORDER — DEXAMETHASONE SODIUM PHOSPHATE 10 MG/ML IJ SOLN
INTRAMUSCULAR | Status: AC
  Filled 2022-02-06: qty 1

## 2022-02-06 MED ORDER — PROPOFOL 10 MG/ML IV BOLUS
INTRAVENOUS | Status: DC | PRN
  Administered 2022-02-06: 160 mg via INTRAVENOUS

## 2022-02-06 MED ORDER — PHENYLEPHRINE HCL-NACL 20-0.9 MG/250ML-% IV SOLN
INTRAVENOUS | Status: DC | PRN
  Administered 2022-02-06: 30 ug/min via INTRAVENOUS

## 2022-02-06 MED ORDER — MIDAZOLAM HCL 2 MG/2ML IJ SOLN
INTRAMUSCULAR | Status: AC
  Filled 2022-02-06: qty 2

## 2022-02-06 MED ORDER — BUPIVACAINE-EPINEPHRINE (PF) 0.25% -1:200000 IJ SOLN
INTRAMUSCULAR | Status: DC | PRN
  Administered 2022-02-06: 40 mL

## 2022-02-06 MED ORDER — DEXMEDETOMIDINE HCL IN NACL 80 MCG/20ML IV SOLN
INTRAVENOUS | Status: AC
  Filled 2022-02-06: qty 20

## 2022-02-06 MED ORDER — ROCURONIUM BROMIDE 10 MG/ML (PF) SYRINGE
PREFILLED_SYRINGE | INTRAVENOUS | Status: DC | PRN
  Administered 2022-02-06: 60 mg via INTRAVENOUS

## 2022-02-06 MED ORDER — PHENYLEPHRINE 80 MCG/ML (10ML) SYRINGE FOR IV PUSH (FOR BLOOD PRESSURE SUPPORT)
PREFILLED_SYRINGE | INTRAVENOUS | Status: AC
  Filled 2022-02-06: qty 10

## 2022-02-06 MED ORDER — ROCURONIUM BROMIDE 10 MG/ML (PF) SYRINGE
PREFILLED_SYRINGE | INTRAVENOUS | Status: AC
  Filled 2022-02-06: qty 10

## 2022-02-06 MED ORDER — MIDAZOLAM HCL 2 MG/2ML IJ SOLN
INTRAMUSCULAR | Status: DC | PRN
  Administered 2022-02-06: 2 mg via INTRAVENOUS

## 2022-02-06 MED ORDER — EPHEDRINE SULFATE-NACL 50-0.9 MG/10ML-% IV SOSY
PREFILLED_SYRINGE | INTRAVENOUS | Status: DC | PRN
  Administered 2022-02-06: 5 mg via INTRAVENOUS
  Administered 2022-02-06: 10 mg via INTRAVENOUS

## 2022-02-06 MED ORDER — AMISULPRIDE (ANTIEMETIC) 5 MG/2ML IV SOLN: 10.0000 mg | Freq: Once | INTRAVENOUS | Status: DC | PRN

## 2022-02-06 MED ORDER — CEFAZOLIN SODIUM-DEXTROSE 2-4 GM/100ML-% IV SOLN
2.0000 g | INTRAVENOUS | Status: AC
  Administered 2022-02-06: 2 g via INTRAVENOUS
  Filled 2022-02-06: qty 100

## 2022-02-06 MED ORDER — FENTANYL CITRATE (PF) 250 MCG/5ML IJ SOLN
INTRAMUSCULAR | Status: AC
  Filled 2022-02-06: qty 5

## 2022-02-06 MED ORDER — BUPIVACAINE LIPOSOME 1.3 % IJ SUSP
INTRAMUSCULAR | Status: AC
  Filled 2022-02-06: qty 20

## 2022-02-06 MED ORDER — METHYLPREDNISOLONE ACETATE 40 MG/ML IJ SUSP
INTRAMUSCULAR | Status: DC | PRN
  Administered 2022-02-06: 40 mg

## 2022-02-06 MED ORDER — DIPHENHYDRAMINE HCL 50 MG/ML IJ SOLN
INTRAMUSCULAR | Status: DC | PRN
  Administered 2022-02-06: 6.25 mg via INTRAVENOUS

## 2022-02-06 MED ORDER — CHLORHEXIDINE GLUCONATE 0.12 % MT SOLN
15.0000 mL | Freq: Once | OROMUCOSAL | Status: AC
  Administered 2022-02-06: 15 mL via OROMUCOSAL
  Filled 2022-02-06: qty 15

## 2022-02-06 MED ORDER — ONDANSETRON HCL 4 MG/2ML IJ SOLN: 4.0000 mg | Freq: Once | INTRAMUSCULAR | Status: DC | PRN

## 2022-02-06 SURGICAL SUPPLY — 73 items
ADH SKN CLS APL DERMABOND .7 (GAUZE/BANDAGES/DRESSINGS) ×1
AGENT HMST KT MTR STRL THRMB (HEMOSTASIS) ×1
APL SKNCLS STERI-STRIP NONHPOA (GAUZE/BANDAGES/DRESSINGS) ×1
BAG COUNTER SPONGE SURGICOUNT (BAG) ×1 IMPLANT
BAG SPNG CNTER NS LX DISP (BAG) ×1
BENZOIN TINCTURE PRP APPL 2/3 (GAUZE/BANDAGES/DRESSINGS) IMPLANT
BNDG GAUZE DERMACEA FLUFF 4 (GAUZE/BANDAGES/DRESSINGS) ×1 IMPLANT
BNDG GZE DERMACEA 4 6PLY (GAUZE/BANDAGES/DRESSINGS) ×1
BUR ROUND PRECISION 4.0 (BURR) ×1 IMPLANT
CABLE BIPOLOR RESECTION CORD (MISCELLANEOUS) ×1 IMPLANT
CANISTER SUCT 3000ML PPV (MISCELLANEOUS) ×1 IMPLANT
COVER SURGICAL LIGHT HANDLE (MISCELLANEOUS) ×1 IMPLANT
DERMABOND ADVANCED .7 DNX12 (GAUZE/BANDAGES/DRESSINGS) IMPLANT
DRAIN CHANNEL 15F RND FF W/TCR (WOUND CARE) IMPLANT
DRAPE POUCH INSTRU U-SHP 10X18 (DRAPES) ×2 IMPLANT
DRAPE SURG 17X23 STRL (DRAPES) ×4 IMPLANT
DURAPREP 26ML APPLICATOR (WOUND CARE) ×1 IMPLANT
ELECT BLADE 4.0 EZ CLEAN MEGAD (MISCELLANEOUS) ×1
ELECT CAUTERY BLADE 6.4 (BLADE) ×1 IMPLANT
ELECT REM PT RETURN 9FT ADLT (ELECTROSURGICAL) ×1
ELECTRODE BLDE 4.0 EZ CLN MEGD (MISCELLANEOUS) ×1 IMPLANT
ELECTRODE REM PT RTRN 9FT ADLT (ELECTROSURGICAL) ×1 IMPLANT
EVACUATOR SILICONE 100CC (DRAIN) IMPLANT
FILTER STRAW FLUID ASPIR (MISCELLANEOUS) ×1 IMPLANT
GAUZE 4X4 16PLY ~~LOC~~+RFID DBL (SPONGE) ×2 IMPLANT
GAUZE SPONGE 4X4 12PLY STRL (GAUZE/BANDAGES/DRESSINGS) ×1 IMPLANT
GLOVE BIO SURGEON STRL SZ7 (GLOVE) ×1 IMPLANT
GLOVE BIO SURGEON STRL SZ8 (GLOVE) ×1 IMPLANT
GLOVE BIOGEL PI IND STRL 7.0 (GLOVE) ×1 IMPLANT
GLOVE BIOGEL PI IND STRL 8 (GLOVE) ×1 IMPLANT
GLOVE SURG ENC MOIS LTX SZ6.5 (GLOVE) ×1 IMPLANT
GOWN STRL REUS W/ TWL LRG LVL3 (GOWN DISPOSABLE) ×1 IMPLANT
GOWN STRL REUS W/ TWL XL LVL3 (GOWN DISPOSABLE) ×2 IMPLANT
GOWN STRL REUS W/TWL LRG LVL3 (GOWN DISPOSABLE) ×1
GOWN STRL REUS W/TWL XL LVL3 (GOWN DISPOSABLE) ×2
IV CATH 14GX2 1/4 (CATHETERS) ×1 IMPLANT
KIT BASIN OR (CUSTOM PROCEDURE TRAY) ×1 IMPLANT
KIT POSITION SURG JACKSON T1 (MISCELLANEOUS) ×1 IMPLANT
KIT TURNOVER KIT B (KITS) ×1 IMPLANT
MARKER SKIN DUAL TIP RULER LAB (MISCELLANEOUS) ×1 IMPLANT
NDL 18GX1X1/2 (RX/OR ONLY) (NEEDLE) ×1 IMPLANT
NDL 22X1.5 STRL (OR ONLY) (MISCELLANEOUS) ×1 IMPLANT
NDL HYPO 25GX1X1/2 BEV (NEEDLE) ×1 IMPLANT
NDL SPNL 18GX3.5 QUINCKE PK (NEEDLE) ×2 IMPLANT
NEEDLE 18GX1X1/2 (RX/OR ONLY) (NEEDLE) ×1 IMPLANT
NEEDLE 22X1.5 STRL (OR ONLY) (MISCELLANEOUS) ×1 IMPLANT
NEEDLE HYPO 25GX1X1/2 BEV (NEEDLE) ×1 IMPLANT
NEEDLE SPNL 18GX3.5 QUINCKE PK (NEEDLE) ×2 IMPLANT
NS IRRIG 1000ML POUR BTL (IV SOLUTION) ×1 IMPLANT
PACK LAMINECTOMY ORTHO (CUSTOM PROCEDURE TRAY) ×1 IMPLANT
PACK UNIVERSAL I (CUSTOM PROCEDURE TRAY) ×1 IMPLANT
PAD ARMBOARD 7.5X6 YLW CONV (MISCELLANEOUS) ×2 IMPLANT
PATTIES SURGICAL .5 X.5 (GAUZE/BANDAGES/DRESSINGS) IMPLANT
PATTIES SURGICAL .5 X1 (DISPOSABLE) ×1 IMPLANT
PENCIL BUTTON HOLSTER BLD 10FT (ELECTRODE) IMPLANT
SPONGE INTESTINAL PEANUT (DISPOSABLE) ×1 IMPLANT
SPONGE SURGIFOAM ABS GEL SZ50 (HEMOSTASIS) ×1 IMPLANT
STRIP CLOSURE SKIN 1/2X4 (GAUZE/BANDAGES/DRESSINGS) IMPLANT
SURGIFLO W/THROMBIN 8M KIT (HEMOSTASIS) IMPLANT
SUT MNCRL AB 4-0 PS2 18 (SUTURE) ×1 IMPLANT
SUT VIC AB 0 CT1 18XCR BRD 8 (SUTURE) IMPLANT
SUT VIC AB 0 CT1 8-18 (SUTURE)
SUT VIC AB 1 CT1 18XCR BRD 8 (SUTURE) ×1 IMPLANT
SUT VIC AB 1 CT1 8-18 (SUTURE) ×1
SUT VIC AB 2-0 CT2 18 VCP726D (SUTURE) ×1 IMPLANT
SYR 20ML LL LF (SYRINGE) ×1 IMPLANT
SYR BULB IRRIG 60ML STRL (SYRINGE) ×1 IMPLANT
SYR CONTROL 10ML LL (SYRINGE) ×2 IMPLANT
SYR TB 1ML LUER SLIP (SYRINGE) ×4 IMPLANT
TOWEL GREEN STERILE (TOWEL DISPOSABLE) ×1 IMPLANT
TOWEL GREEN STERILE FF (TOWEL DISPOSABLE) ×1 IMPLANT
WATER STERILE IRR 1000ML POUR (IV SOLUTION) ×1 IMPLANT
YANKAUER SUCT BULB TIP NO VENT (SUCTIONS) ×1 IMPLANT

## 2022-02-06 NOTE — H&P (Signed)
PREOPERATIVE H&P  Chief Complaint: Right leg pain  HPI: Laura Blackburn is a 52 y.o. female who presents with ongoing pain in the right leg  MRI reveals a right L5/S1 HNP, with compression of the right S1 nerve noted  Patient has failed multiple forms of conservative care and continues to have pain (see office notes for additional details regarding the patient's full course of treatment)  Past Medical History:  Diagnosis Date   Anemia    iron deficiency   Arthritis    Basal cell carcinoma 11/19/2016   right ant shoulder/superficial   Basal cell carcinoma 12/24/2016   left upper back/excision   Chicken pox    Depression 2010   Postpartum, pt no longer feels depression   Ectopic pregnancy 2008   Family history of adverse reaction to anesthesia    Mom has hx of PONV   GERD (gastroesophageal reflux disease)    History of kidney stones    Hypertension    PONV (postoperative nausea and vomiting)    Past Surgical History:  Procedure Laterality Date   BTL reversal  2006   CESAREAN SECTION  2007   CESAREAN SECTION  2010   COLONOSCOPY WITH PROPOFOL N/A 03/16/2020   Procedure: COLONOSCOPY WITH PROPOFOL;  Surgeon: Virgel Manifold, MD;  Location: [facility] ENDOSCOPY;  Service: Endoscopy;  Laterality: N/A;   LAPAROSCOPY FOR ECTOPIC PREGNANCY  08/2006   left side   LITHOTRIPSY  2004   for kidney stones   [place]   miscarriage  09/2006   D&C   ROTATOR CUFF REPAIR Right 2020   TONSILLECTOMY  1979   TUBAL LIGATION  1995   BTL   TYMPANOSTOMY TUBE PLACEMENT     2 sets of ear tubes   UMBILICAL HERNIA REPAIR  2011   Social History   Socioeconomic History   Marital status: Married    Spouse name: Not on file   Number of children: 4   Years of education: Not on file   Highest education level: Not on file  Occupational History   Occupation: Medical illustrator  Tobacco Use   Smoking status: Never   Smokeless tobacco: Never  Vaping Use   Vaping  Use: Never used  Substance and Sexual Activity   Alcohol use: No   Drug use: No   Sexual activity: Yes  Other Topics Concern   Not on file  Social History Narrative   Not on file   Social Determinants of Health   Financial Resource Strain: Not on file  Food Insecurity: Not on file  Transportation Needs: Not on file  Physical Activity: Not on file  Stress: Not on file  Social Connections: Not on file   Family History  Problem Relation Age of Onset   Diabetes Father    COPD Father    Hearing loss Father    Arthritis Mother    Depression Mother    Drug abuse Mother    Hearing loss Mother    Hyperlipidemia Mother    Mental illness Mother    Cancer Paternal Uncle        lung CA   Alzheimer's disease Maternal Grandmother    Early death Maternal Grandmother    Hearing loss Maternal Grandmother    Heart disease Maternal Grandmother    Hyperlipidemia Maternal Grandmother    Stroke Maternal Grandmother    Heart disease Paternal Grandfather 3       MI   Early death Paternal  Grandfather    Breast cancer Maternal Aunt 52   Heart disease Maternal Grandfather    Hypertension Maternal Grandfather    Allergies  Allergen Reactions   Oxycodone-Acetaminophen     REACTION: Hallucinations   Prior to Admission medications   Medication Sig Start Date End Date Taking? Authorizing Provider  Ascorbic Acid (VITAMIN C) 1000 MG tablet Take 1,000 mg by mouth daily.   Yes [provider]  gabapentin (NEURONTIN) 300 MG capsule Take 1 capsule (300 mg total) by mouth at bedtime. Patient taking differently: Take 300 mg by mouth 2 (two) times daily. 06/27/19  Yes Marval Regal, NP  methocarbamol (ROBAXIN) 500 MG tablet Take 1 tablet (500 mg total) by mouth every 6 (six) hours as needed for muscle spasms. 01/05/22  Yes Johnn Hai, PA-C  Multiple Vitamin (MULTIVITAMIN) tablet Take 1 tablet by mouth daily.   Yes [provider]  traMADol (ULTRAM) 50 MG tablet Take 50 mg  by mouth every 6 (six) hours as needed for moderate pain.   Yes [provider]  clobetasol cream (TEMOVATE) 1.02 % Apply 1 Application topically 2 (two) times daily. For up to 2 weeks as needed for itch/bites. Avoid applying to face, groin, and axilla. Use as directed. Long-term use can cause thinning of the skin. Patient not taking: Reported on 01/29/2022 09/12/21   Laurence Ferrari, [place], MD  escitalopram (LEXAPRO) 10 MG tablet Take 1 tablet (10 mg total) by mouth daily. Patient not taking: No sig reported 06/29/19   Marval Regal, NP  HYDROcodone-acetaminophen (NORCO/VICODIN) 5-325 MG tablet Take 1 tablet by mouth every 6 (six) hours as needed. Patient not taking: Reported on 01/29/2022 01/05/22 01/05/23  Johnn Hai, PA-C  predniSONE (DELTASONE) 10 MG tablet Take 6 tablets  today, on day 2 take 5 tablets, day 3 take 4 tablets, day 4 take 3 tablets, day 5 take  2 tablets and 1 tablet the last day Patient not taking: Reported on 01/29/2022 01/05/22   Johnn Hai, PA-C     All other systems have been reviewed and were otherwise negative with the exception of those mentioned in the HPI and as above.  Physical Exam: Vitals:   02/06/22 0541  BP: 108/62  Pulse: 83  Resp: 18  Temp: 98.9 F (37.2 C)  SpO2: 100%    Body mass index is 24.89 kg/m.  General: Alert, no acute distress Cardiovascular: No pedal edema Respiratory: No cyanosis, no use of accessory musculature Skin: No lesions in the area of chief complaint Neurologic: Sensation intact distally Psychiatric: Patient is competent for consent with normal mood and affect Lymphatic: No axillary or cervical lymphadenopathy   Assessment/Plan: Right S1 radiculopathy secondary to an extruded right-sided L5-S1 disc herniation, compressing the right S1 nerve.  This is resulting in significant constant right leg pain and weakness.  Plan for Procedure(s): RIGHT-SIDED LUMBAR 5 - Steele,  MD 02/06/2022 6:57 AM

## 2022-02-06 NOTE — Anesthesia Procedure Notes (Signed)
Procedure Name: Intubation Date/Time: 02/06/2022 7:37 AM  Performed by: Lind Guest, CRNAPre-anesthesia Checklist: Patient identified, Emergency Drugs available, Suction available, Patient being monitored and Timeout performed Patient Re-evaluated:Patient Re-evaluated prior to induction Oxygen Delivery Method: Circle system utilized Preoxygenation: Pre-oxygenation with 100% oxygen Induction Type: IV induction Ventilation: Mask ventilation without difficulty Laryngoscope Size: Mac and 3 Grade View: Grade I Tube type: Oral Rae Tube size: 7.0 mm Number of attempts: 1 Placement Confirmation: ETT inserted through vocal cords under direct vision, positive ETCO2 and breath sounds checked- equal and bilateral Secured at: 22 cm Tube secured with: Tape Dental Injury: Teeth and Oropharynx as per pre-operative assessment

## 2022-02-06 NOTE — Op Note (Signed)
PATIENT NAME: Laura Blackburn   MEDICAL RECORD NO.:   [id number]    DATE OF BIRTH: Nov 27, 1970   DATE OF PROCEDURE: 02/06/2022                               OPERATIVE REPORT     PREOPERATIVE DIAGNOSES: 1. Right-sided S1 radiculopathy. 2. Large right-sided L5-S1 disk herniation causing severe     compression of the right S1 nerve.   POSTOPERATIVE DIAGNOSES: 1. Right-sided S1 radiculopathy. 2. Large right-sided L5-S1 disk herniation causing severe     compression of the right S1 nerve.   PROCEDURES:  Right-sided L5-S1 laminotomy with partial facetectomy and removal of large herniated right-sided L5-S1 disk fragment.   SURGEON:  Phylliss Bob, MD.   ASSISTANTPricilla Holm, PA-C.   ANESTHESIA:  General endotracheal anesthesia.   COMPLICATIONS:  None.   DISPOSITION:  Stable.   ESTIMATED BLOOD LOSS:  Minimal.   INDICATIONS FOR SURGERY:  Briefly, Laura Blackburn is a pleasant 52 year old female, who did present to me with severe pain in the right leg.  The patient's MRI did reveal the findings outlined above, clearly notable for a large herniated disk fragment at L5/S1 on the right. The pain was rather severe, and she did note prominent weakness in the right leg as well.  We did discuss treatment options and we did ultimately elect to proceed with the procedure reflected above.  The patient was fully made aware of the risks of surgery, including the risk of recurrent herniation and the need for subsequent surgery, including the possibility of a subsequent diskectomy and/or fusion.   OPERATIVE DETAILS:  On 02/06/2022, the patient was brought to surgery and general endotracheal anesthesia was administered.  The patient was placed prone on a well-padded flat Jackson bed with a spinal frame.  Antibiotics were given.  The back was prepped and draped and a time-out procedure was performed.  At this point, a midline incision was made directly over the L5-S1 intervertebral  space.  A curvilinear incision was made just to the right of the midline into the fascia.  A self-retaining McCulloch retractor was placed.  The lamina of L5 and S1 was identified and subperiosteally exposed.  I then removed the lateral aspect of the L5-S1 ligamentum flavum.  Readily identified was the traversing right S1 nerve, which was noted to be under obvious tension, and was noted to be rather erythematous.  I was able to gently gain medial retraction of the nerve, and in doing so, large extruded disk fragments were readily noted, which were migrated inferiorly, behind the S1 vertebral body.  These were removed in multiple fragments.  I then thoroughly evaluated the right lateral recess and traversing right S1 nerve.  The lateral recess and nerves were noted to be entirely free of compression.  Minor areas of epidural bleeding were identified, and this was controlled using Surgiflo. I was very pleased with the final decompression that I was able to accomplish.  At this point, the wound was copiously irrigated with normal saline.   All bleeding was controlled at the termination of the procedure.  At this point, 20 mg of Depo-Medrol was introduced about the epidural space in the region of the right S1 nerve.  The wound was then closed in layers using #1 Vicryl followed by 0 Vicryl, followed by 4-0 Monocryl. Benzoin and Steri-Strips were applied followed by a sterile dressing. All instrument counts were correct  at the termination of the procedure.   Of note, Pricilla Holm was my assistant throughout surgery, and did aid in retraction, suctioning, the decompression, and closure.       Phylliss Bob, MD

## 2022-02-06 NOTE — Anesthesia Postprocedure Evaluation (Signed)
Anesthesia Post Note  Patient: ZERAH HILYER  Procedure(s) Performed: RIGHT-SIDED LUMBAR FIVE THROUGH SACRUM ONE MICRODISECTOMY (Right: Spine Lumbar)     Patient location during evaluation: PACU Anesthesia Type: General Level of consciousness: awake and alert and oriented Pain management: pain level controlled Vital Signs Assessment: post-procedure vital signs reviewed and stable Respiratory status: spontaneous breathing, nonlabored ventilation and respiratory function stable Cardiovascular status: blood pressure returned to baseline and stable Postop Assessment: no apparent nausea or vomiting Anesthetic complications: no   No notable events documented.  Last Vitals:  Vitals:   02/06/22 1000 02/06/22 1015  BP: 126/60   Pulse: (!) 103 94  Resp: 15 19  Temp:  36.5 C  SpO2: 96% 99%    Last Pain:  Vitals:   02/06/22 1000  PainSc: Asleep                 Weyman Bogdon A.

## 2022-02-06 NOTE — Transfer of Care (Signed)
Immediate Anesthesia Transfer of Care Note  Patient: Laura Blackburn  Procedure(s) Performed: RIGHT-SIDED LUMBAR FIVE THROUGH SACRUM ONE MICRODISECTOMY (Right: Spine Lumbar)  Patient Location: PACU  Anesthesia Type:General  Level of Consciousness: drowsy and patient cooperative  Airway & Oxygen Therapy: Patient Spontanous Breathing  Post-op Assessment: Report given to RN and Post -op Vital signs reviewed and stable  Post vital signs: Reviewed and stable  Last Vitals:  Vitals Value Taken Time  BP 128/52 02/06/22 0935  Temp 36.5 C 02/06/22 0935  Pulse 106 02/06/22 0937  Resp 16 02/06/22 0937  SpO2 100 % 02/06/22 0937  Vitals shown include unvalidated device data.  Last Pain:  Vitals:   02/06/22 0614  PainSc: 8       Patients Stated Pain Goal: 0 (35/36/14 4315)  Complications: No notable events documented.

## 2022-02-07 ENCOUNTER — Encounter (HOSPITAL_COMMUNITY): Payer: Self-pay | Admitting: Orthopedic Surgery

## 2022-02-12 ENCOUNTER — Emergency Department: Payer: No Typology Code available for payment source

## 2022-02-12 ENCOUNTER — Encounter: Payer: Self-pay | Admitting: Emergency Medicine

## 2022-02-12 ENCOUNTER — Emergency Department
Admission: EM | Admit: 2022-02-12 | Discharge: 2022-02-12 | Disposition: A | Discharge status: Home / Self Care | Payer: No Typology Code available for payment source | Attending: Student in an Organized Health Care Education/Training Program | Admitting: Student in an Organized Health Care Education/Training Program

## 2022-02-12 DIAGNOSIS — R0602 Shortness of breath: Secondary | ICD-10-CM | POA: Diagnosis not present

## 2022-02-12 DIAGNOSIS — R531 Weakness: Secondary | ICD-10-CM | POA: Diagnosis not present

## 2022-02-12 DIAGNOSIS — R5383 Other fatigue: Secondary | ICD-10-CM | POA: Diagnosis present

## 2022-02-12 DIAGNOSIS — R000 Tachycardia, unspecified: Secondary | ICD-10-CM | POA: Diagnosis not present

## 2022-02-12 LAB — CBC
HCT: 41.2 % (ref 36.0–46.0)
Hemoglobin: 13.8 g/dL (ref 12.0–15.0)
MCH: 30.8 pg (ref 26.0–34.0)
MCHC: 33.5 g/dL (ref 30.0–36.0)
MCV: 92 fL (ref 80.0–100.0)
Platelets: 241 10*3/uL (ref 150–400)
RBC: 4.48 MIL/uL (ref 3.87–5.11)
RDW: 11.9 % (ref 11.5–15.5)
WBC: 8.6 10*3/uL (ref 4.0–10.5)
nRBC: 0 % (ref 0.0–0.2)

## 2022-02-12 LAB — BASIC METABOLIC PANEL
Anion gap: 10 (ref 5–15)
BUN: 18 mg/dL (ref 6–20)
CO2: 25 mmol/L (ref 22–32)
Calcium: 9.8 mg/dL (ref 8.9–10.3)
Chloride: 104 mmol/L (ref 98–111)
Creatinine, Ser: 0.85 mg/dL (ref 0.44–1.00)
GFR, Estimated: >60 mL/min (ref >60)
Glucose, Bld: 140 mg/dL — ABNORMAL HIGH (ref 70–99)
Potassium: 4.1 mmol/L (ref 3.5–5.1)
Sodium: 139 mmol/L (ref 135–145)

## 2022-02-12 LAB — URINALYSIS, ROUTINE W REFLEX MICROSCOPIC
Bacteria, UA: NONE SEEN
Bilirubin Urine: NEGATIVE
Glucose, UA: NEGATIVE mg/dL
Hgb urine dipstick: NEGATIVE
Ketones, ur: NEGATIVE mg/dL
Nitrite: NEGATIVE
Protein, ur: NEGATIVE mg/dL
Specific Gravity, Urine: 1.019 (ref 1.005–1.030)
pH: 7 (ref 5.0–8.0)

## 2022-02-12 LAB — POC URINE PREG, ED: Preg Test, Ur: NEGATIVE

## 2022-02-12 LAB — TROPONIN I (HIGH SENSITIVITY)
Troponin I (High Sensitivity): 4 ng/L (ref <18)
Troponin I (High Sensitivity): 6 ng/L (ref <18)

## 2022-02-12 MED ORDER — ONDANSETRON HCL 4 MG/2ML IJ SOLN
INTRAMUSCULAR | Status: AC
  Administered 2022-02-12: 4 mg via INTRAVENOUS
  Filled 2022-02-12: qty 2

## 2022-02-12 MED ORDER — ONDANSETRON HCL 4 MG/2ML IJ SOLN: 4.0000 mg | Freq: Once | INTRAMUSCULAR | Status: AC

## 2022-02-12 MED ORDER — SODIUM CHLORIDE 0.45 % IV BOLUS
1000.0000 mL | Freq: Once | INTRAVENOUS | Status: AC
  Administered 2022-02-12: 1000 mL via INTRAVENOUS

## 2022-02-12 MED ORDER — IOHEXOL 350 MG/ML SOLN
75.0000 mL | Freq: Once | INTRAVENOUS | Status: AC | PRN
  Administered 2022-02-12: 75 mL via INTRAVENOUS

## 2022-02-12 NOTE — ED Notes (Signed)
Pt reports having back surgery last week at [facility].   Today pt began having sob, weakness all over.  No chest pain.  Pt denies urinary sx.  Pt alert  speech clear.

## 2022-02-12 NOTE — ED Triage Notes (Signed)
Pt presents via POV with complaints of SOB with associated dizziness that started tonight. Pt had a microdisectomy last Thursday and was advised to lay flat on her back - she notes difficulty taking a full deep breaths. Denies CP, N/V/D, fevers, chills.

## 2022-02-12 NOTE — ED Triage Notes (Signed)
Back surgery don Wednesday. Since surgery has been feeling weak, dizzy, and SOB.  Arrives AAOx3.  Skin warm and dry. No SOB/ DOE.  Speaks in complete sentences.

## 2022-02-12 NOTE — ED Notes (Signed)
Pt actively vomiting clear liquid in triage - see MAR for intervention.

## 2022-02-12 NOTE — ED Notes (Signed)
Pt to ct scan.

## 2022-02-12 NOTE — ED Provider Notes (Signed)
[facility] Provider Note    Event Date/Time   First MD Initiated Contact with Patient 02/12/22 2054     (approximate)   History   Shortness of Breath   HPI  GWENDELYN LANTING is a 52 y.o. female no significant past medical history other than recent lumbar surgery Ms. Patrice Paradise presents to the ER today for fatigue and shortness of breath.  States that today was the first day that she was able to get up and walk when outside.  She not having significant pain.  No fevers denies any chest pain currently.  No abdominal pain states that her appetite has been decreased recently.  Denies any nausea or vomiting.  No history of DVT or PE.     Physical Exam   Triage Vital Signs: ED Triage Vitals  Enc Vitals Group     BP 02/12/22 1907 130/75     Pulse Rate 02/12/22 1907 (!) 116     Resp 02/12/22 1907 18     Temp 02/12/22 1907 97.6 F (36.4 C)     Temp Source 02/12/22 1907 Oral     SpO2 02/12/22 1907 100 %     Weight 02/12/22 1905 145 lb 1 oz (65.8 kg)     Height 02/12/22 1905 '5\' 4"'$  (1.626 m)     Head Circumference --      Peak Flow --      Pain Score 02/12/22 1905 9     Pain Loc --      Pain Edu? --      Excl. in [place]? --     Most recent vital signs: Vitals:   02/12/22 2100 02/12/22 2130  BP: 104/65 117/76  Pulse: 93 85  Resp:    Temp:    SpO2: 100% 100%     Constitutional: Alert  Eyes: Conjunctivae are normal.  Head: Atraumatic. Nose: No congestion/rhinnorhea. Mouth/Throat: Mucous membranes are moist.   Neck: Painless ROM.  Cardiovascular:   Good peripheral circulation. Respiratory: Normal respiratory effort.  No retractions.  Gastrointestinal: Soft and nontender.  Musculoskeletal:  no deformity, surgical site appears c/d/i Neurologic:  MAE spontaneously. No gross focal neurologic deficits are appreciated.  Skin:  Skin is warm, dry and intact. No rash noted. Psychiatric: Mood and affect are normal. Speech and behavior are normal.    ED  Results / Procedures / Treatments   Labs (all labs ordered are listed, but only abnormal results are displayed) Labs Reviewed  BASIC METABOLIC PANEL - Abnormal; Notable for the following components:      Result Value   Glucose, Bld 140 (*)    All other components within normal limits  URINALYSIS, ROUTINE W REFLEX MICROSCOPIC - Abnormal; Notable for the following components:   Color, Urine STRAW (*)    APPearance CLEAR (*)    Leukocytes,Ua TRACE (*)    All other components within normal limits  CBC  POC URINE PREG, ED  TROPONIN I (HIGH SENSITIVITY)  TROPONIN I (HIGH SENSITIVITY)     EKG  ED ECG REPORT I, Merlyn Lot, the attending physician, personally viewed and interpreted this ECG.   Date: 02/12/2022  EKG Time: 19:06  Rate: 120  Rhythm: sinus  Axis: normal  Intervals: normal  ST&T Change: no stemi, nonspecific st abn    RADIOLOGY Please see ED Course for my review and interpretation.  I personally reviewed all radiographic images ordered to evaluate for the above acute complaints and reviewed radiology reports and findings.  These findings  were personally discussed with the patient.  Please see medical record for radiology report.    PROCEDURES:  Critical Care performed:   Procedures   MEDICATIONS ORDERED IN ED: Medications  ondansetron (ZOFRAN) injection 4 mg (4 mg Intravenous Given 02/12/22 1920)  sodium chloride 0.45 % bolus 1,000 mL (1,000 mLs Intravenous New Bag/Given 02/12/22 2246)  iohexol (OMNIPAQUE) 350 MG/ML injection 75 mL (75 mLs Intravenous Contrast Given 02/12/22 2200)     IMPRESSION / MDM / ASSESSMENT AND PLAN / ED COURSE  I reviewed the triage vital signs and the nursing notes.                              Differential diagnosis includes, but is not limited to, dehydration, electrolyte abnormality, anemia, pneumonia, CHF, PE, DVT, ACS  Patient presenting to the ER for evaluation of symptoms as described above.  Based on symptoms,  risk factors and considered above differential, this presenting complaint could reflect a potentially life-threatening illness therefore the patient will be placed on continuous pulse oximetry and telemetry for monitoring.  Laboratory evaluation will be sent to evaluate for the above complaints.  Patient nontoxic-appearing mildly tachycardic postop complaint shortness of breath and weakness.  No motor weakness.  Surgical site appears clean dry and intact.  Low suspicion for ACS will observe and order troponin.  Will order CTA as well as duplex.   Clinical Course as of 02/12/22 2327  Wed Feb 12, 2022  2254 CTA my review and interpretation without evidence of pulmonary embolism.  Per radiology report possible mild bronchitis.  Do not appreciate any significant wheezing on exam.  No leukocytosis.  Suspect this is primarily dehydration possible deconditioning.  Troponin negative.  No wheezing on exam no history of smoking.  Doubt bronchitis.  At this point patient does appear stable and appropriate for outpatient follow-up. [PR]    Clinical Course User Index [PR] Merlyn Lot, MD       FINAL CLINICAL IMPRESSION(S) / ED DIAGNOSES   Final diagnoses:  Weakness     Rx / DC Orders   ED Discharge Orders     None        Note:  This document was prepared using Dragon voice recognition software and may include unintentional dictation errors.]    Merlyn Lot, MD 02/12/22 2327

## 2022-02-12 NOTE — ED Notes (Signed)
Poct pregnancy Negative 

## 2022-04-14 ENCOUNTER — Emergency Department
Admission: EM | Admit: 2022-04-14 | Discharge: 2022-04-15 | Disposition: A | Discharge status: Home / Self Care | Payer: No Typology Code available for payment source | Attending: Emergency Medicine | Admitting: Emergency Medicine

## 2022-04-14 ENCOUNTER — Emergency Department: Payer: No Typology Code available for payment source

## 2022-04-14 ENCOUNTER — Other Ambulatory Visit: Payer: Self-pay

## 2022-04-14 DIAGNOSIS — R000 Tachycardia, unspecified: Secondary | ICD-10-CM | POA: Insufficient documentation

## 2022-04-14 DIAGNOSIS — J189 Pneumonia, unspecified organism: Secondary | ICD-10-CM

## 2022-04-14 DIAGNOSIS — J181 Lobar pneumonia, unspecified organism: Secondary | ICD-10-CM | POA: Insufficient documentation

## 2022-04-14 DIAGNOSIS — R0602 Shortness of breath: Secondary | ICD-10-CM | POA: Diagnosis present

## 2022-04-14 LAB — BASIC METABOLIC PANEL
Anion gap: 11 (ref 5–15)
BUN: 14 mg/dL (ref 6–20)
CO2: 23 mmol/L (ref 22–32)
Calcium: 9.5 mg/dL (ref 8.9–10.3)
Chloride: 104 mmol/L (ref 98–111)
Creatinine, Ser: 0.97 mg/dL (ref 0.44–1.00)
GFR, Estimated: >60 mL/min (ref >60)
Glucose, Bld: 210 mg/dL — ABNORMAL HIGH (ref 70–99)
Potassium: 3.4 mmol/L — ABNORMAL LOW (ref 3.5–5.1)
Sodium: 138 mmol/L (ref 135–145)

## 2022-04-14 LAB — CBC
HCT: 40.1 % (ref 36.0–46.0)
Hemoglobin: 13.5 g/dL (ref 12.0–15.0)
MCH: 31 pg (ref 26.0–34.0)
MCHC: 33.7 g/dL (ref 30.0–36.0)
MCV: 92.2 fL (ref 80.0–100.0)
Platelets: 171 10*3/uL (ref 150–400)
RBC: 4.35 MIL/uL (ref 3.87–5.11)
RDW: 11.9 % (ref 11.5–15.5)
WBC: 7.9 10*3/uL (ref 4.0–10.5)
nRBC: 0 % (ref 0.0–0.2)

## 2022-04-14 LAB — TROPONIN I (HIGH SENSITIVITY): Troponin I (High Sensitivity): 4 ng/L (ref <18)

## 2022-04-14 MED ORDER — IOHEXOL 300 MG/ML  SOLN
75.0000 mL | Freq: Once | INTRAMUSCULAR | Status: AC | PRN
  Administered 2022-04-14: 75 mL via INTRAVENOUS

## 2022-04-14 MED ORDER — IOHEXOL 350 MG/ML SOLN: 75.0000 mL | Freq: Once | INTRAVENOUS | Status: DC | PRN

## 2022-04-14 NOTE — ED Provider Notes (Signed)
[facility] Provider Note  Patient Contact: 11:09 PM (approximate)   History   Shortness of Breath and Cough   HPI  Laura Blackburn is a 52 y.o. female who presents the emergency department for CT scan of her chest.  Patient had bronchitic like symptoms and went to urgent care today.  Had a chest x-ray, was placed on meds for both bronchitis and possible commune acquired pneumonia and sent home.  Radiologist read x-ray after patient had been discharged and noted a possible 1 cm mass in the lung.  It is recommended that CT be ordered and she was advised to come to the emergency department for same.  Patient had a recent CT scan at the end of January with no evidence of mass.  She has had no constitutional symptoms of unexplained fevers.  She has had an 8 pound weight loss unintentionally but this has been over several months.  She has had surgery, changed her diet in the interim as well.  Patient with no respiratory symptoms outside of her current illness of cough, congestion, low-grade fevers.  Patient is here for evaluation with CT.     Physical Exam   Triage Vital Signs: ED Triage Vitals  Enc Vitals Group     BP 04/14/22 1820 132/83     Pulse Rate 04/14/22 1820 (!) 109     Resp 04/14/22 1820 18     Temp 04/14/22 1820 98 F (36.7 C)     Temp Source 04/14/22 1820 Oral     SpO2 04/14/22 1820 97 %     Weight --      Height --      Head Circumference --      Peak Flow --      Pain Score 04/14/22 1821 5     Pain Loc --      Pain Edu? --      Excl. in [place]? --     Most recent vital signs: Vitals:   04/14/22 1820  BP: 132/83  Pulse: (!) 109  Resp: 18  Temp: 98 F (36.7 C)  SpO2: 97%     General: Alert and in no acute distress. ENT:      Ears:       Nose: No congestion/rhinnorhea.      Mouth/Throat: Mucous membranes are moist.  Cardiovascular:  Good peripheral perfusion Respiratory: Normal respiratory effort without tachypnea or  retractions. Lungs CTAB. Good air entry to the bases with no decreased or absent breath sounds Musculoskeletal: Full range of motion to all extremities.  Neurologic:  No gross focal neurologic deficits are appreciated.  Skin:   No rash noted Other:   ED Results / Procedures / Treatments   Labs (all labs ordered are listed, but only abnormal results are displayed) Labs Reviewed  BASIC METABOLIC PANEL - Abnormal; Notable for the following components:      Result Value   Potassium 3.4 (*)    Glucose, Bld 210 (*)    All other components within normal limits  CBC  TROPONIN I (HIGH SENSITIVITY)     EKG  ED ECG REPORT I, Charline Bills Jillana Selph,  personally viewed and interpreted this ECG.   Date: 0/01/2022  EKG Time: 1823 hrs.  Rate: 108 bpm  Rhythm: unchanged from previous tracings, sinus tachycardia, no significant change from previous EKG on 02/12/2022  Axis: Normal axis  Intervals:none  ST&T Change: No gross ST elevation or depression noted  Sinus tachycardia.  No significant  change from previous EKGs    RADIOLOGY  I personally viewed, evaluated, and interpreted these images as part of my medical decision making, as well as reviewing the written report by the radiologist.  ED Provider Interpretation: CT scan on 1 middle reveals subsegmental atelectasis with superimposed nodular infiltrate likely infectious given patient's current symptoms.  CT Chest W Contrast  Result Date: 04/14/2022 CLINICAL DATA:  Abnormal xray - lung nodule, >= 1 cm EXAM: CT CHEST WITH CONTRAST TECHNIQUE: Multidetector CT imaging of the chest was performed during intravenous contrast administration. RADIATION DOSE REDUCTION: This exam was performed according to the departmental dose-optimization program which includes automated exposure control, adjustment of the mA and/or kV according to patient size and/or use of iterative reconstruction technique. CONTRAST:  85mL OMNIPAQUE IOHEXOL 300 MG/ML  SOLN  COMPARISON:  02/12/2022 FINDINGS: Cardiovascular: No significant coronary artery calcification. Global cardiac size within normal limits. No pericardial effusion. Central pulmonary arteries are of normal caliber. The thoracic aorta is unremarkable. Mediastinum/Nodes: Visualized thyroid is unremarkable. No pathologic thoracic adenopathy. Esophagus unremarkable. Small hiatal hernia. Lungs/Pleura: There is subsegmental atelectasis within the anteromedial left lower lobe with superimposed nodular infiltrate, likely infectious in the appropriate clinical setting. The lungs are otherwise clear. No pneumothorax or pleural effusion. No central obstructing lesion. Upper Abdomen: No acute abnormality. Musculoskeletal: No chest wall abnormality. No acute or significant osseous findings. IMPRESSION: 1. Subsegmental atelectasis within the anteromedial left lower lobe with superimposed nodular infiltrate, likely infectious in the appropriate clinical setting. Follow-up imaging in 4-6 weeks would be helpful to document resolution. 2. Small hiatal hernia. Electronically Signed   By: Fidela Salisbury M.D.   On: 04/14/2022 23:38    PROCEDURES:  Critical Care performed: No  Procedures   MEDICATIONS ORDERED IN ED: Medications  iohexol (OMNIPAQUE) 300 MG/ML solution 75 mL (75 mLs Intravenous Contrast Given 04/14/22 2306)     IMPRESSION / MDM / ASSESSMENT AND PLAN / ED COURSE  I reviewed the triage vital signs and the nursing notes.                                 Differential diagnosis includes, but is not limited to, lung mass, lung nodule, granuloma, infection, pneumonia  Patient's presentation is most consistent with acute presentation with potential threat to life or bodily function.   Patient's diagnosis is consistent with pneumonia.  Patient presents emergency department from urgent care after having an x-ray that showed a 1 cm nodule/mass.  Patient has had symptoms consistent with pneumonia for the past 5  days.  She went to urgent care, diagnosed with pneumonia and placed on antibiotics.  Over read by radiologist after the patient left was concerning for a 1 cm mass.  She was referred to the emergency department for further evaluation.  She does have infectious symptoms.  Labs are reassuring.  No concern for sepsis based off of vitals and labs.  Patient had a CT scan 2 months ago for an unrelated complaint for our comparison today.  Patient had no mass or lesion structure 2 months ago, has a infiltrate in the left lung consistent with likely atypical infection consistent with patient's presentation.  Informed patient of her results.  I do recommend follow-up with primary care for repeat imaging in 6 to 8 weeks to ensure resolution..  Patient already has prescription for antibiotics from urgent care.  Take these.  Return precautions discussed with the patient.  Patient is  given ED precautions to return to the ED for any worsening or new symptoms.     FINAL CLINICAL IMPRESSION(S) / ED DIAGNOSES   Final diagnoses:  Community acquired pneumonia of right lower lobe of lung     Rx / DC Orders   ED Discharge Orders     None        Note:  This document was prepared using Dragon voice recognition software and may include unintentional dictation errors.   Brynda Peon 04/14/22 2358    Harvest Dark, MD 04/15/22 2216

## 2022-04-14 NOTE — ED Triage Notes (Signed)
Pt to ED via POV from home. Pt reports ongoing cough and SOB. Pt states she went to UC and they did an xray. UC reported to pt initially thought she had PNA and placed on antibiotics and steroids. Pt was then called by MD and states radiologist was concerned for a 1cm mass left midlung field and referred pt for CT scan. Pt denies blood thinners.

## 2022-04-28 ENCOUNTER — Other Ambulatory Visit: Payer: Self-pay | Admitting: Family Medicine

## 2022-04-28 DIAGNOSIS — Z1231 Encounter for screening mammogram for malignant neoplasm of breast: Secondary | ICD-10-CM

## 2022-06-11 ENCOUNTER — Ambulatory Visit: Payer: No Typology Code available for payment source | Admitting: Dermatology

## 2022-08-27 ENCOUNTER — Ambulatory Visit: Payer: No Typology Code available for payment source | Admitting: Dermatology

## 2022-08-27 ENCOUNTER — Encounter: Payer: Self-pay | Admitting: Dermatology

## 2022-08-27 VITALS — BP 142/69 | HR 69

## 2022-08-27 DIAGNOSIS — W908XXA Exposure to other nonionizing radiation, initial encounter: Secondary | ICD-10-CM

## 2022-08-27 DIAGNOSIS — D225 Melanocytic nevi of trunk: Secondary | ICD-10-CM

## 2022-08-27 DIAGNOSIS — Z1283 Encounter for screening for malignant neoplasm of skin: Secondary | ICD-10-CM | POA: Diagnosis not present

## 2022-08-27 DIAGNOSIS — D239 Other benign neoplasm of skin, unspecified: Secondary | ICD-10-CM

## 2022-08-27 DIAGNOSIS — D492 Neoplasm of unspecified behavior of bone, soft tissue, and skin: Secondary | ICD-10-CM

## 2022-08-27 DIAGNOSIS — L814 Other melanin hyperpigmentation: Secondary | ICD-10-CM

## 2022-08-27 DIAGNOSIS — D3612 Benign neoplasm of peripheral nerves and autonomic nervous system, upper limb, including shoulder: Secondary | ICD-10-CM

## 2022-08-27 DIAGNOSIS — D1801 Hemangioma of skin and subcutaneous tissue: Secondary | ICD-10-CM

## 2022-08-27 DIAGNOSIS — D3610 Benign neoplasm of peripheral nerves and autonomic nervous system, unspecified: Secondary | ICD-10-CM

## 2022-08-27 DIAGNOSIS — L821 Other seborrheic keratosis: Secondary | ICD-10-CM | POA: Diagnosis not present

## 2022-08-27 DIAGNOSIS — L818 Other specified disorders of pigmentation: Secondary | ICD-10-CM

## 2022-08-27 DIAGNOSIS — D2371 Other benign neoplasm of skin of right lower limb, including hip: Secondary | ICD-10-CM

## 2022-08-27 DIAGNOSIS — Z85828 Personal history of other malignant neoplasm of skin: Secondary | ICD-10-CM

## 2022-08-27 DIAGNOSIS — D229 Melanocytic nevi, unspecified: Secondary | ICD-10-CM

## 2022-08-27 DIAGNOSIS — L578 Other skin changes due to chronic exposure to nonionizing radiation: Secondary | ICD-10-CM

## 2022-08-27 NOTE — Progress Notes (Signed)
Follow-Up Visit   Subjective  Laura Blackburn is a 52 y.o. female who presents for the following: Skin Cancer Screening and Full Body Skin Exam. Hx of BCCs.  Area of concern on left back. Itches at times. Popped up quickly.   The patient presents for Total-Body Skin Exam (TBSE) for skin cancer screening and mole check. The patient has spots, moles and lesions to be evaluated, some may be new or changing and the patient may have concern these could be cancer.    The following portions of the chart were reviewed this encounter and updated as appropriate: medications, allergies, medical history  Review of Systems:  No other skin or systemic complaints except as noted in HPI or Assessment and Plan.  Objective  Well appearing patient in no apparent distress; mood and affect are within normal limits.  A full examination was performed including scalp, head, eyes, ears, nose, lips, neck, chest, axillae, abdomen, back, buttocks, bilateral upper extremities, bilateral lower extremities, hands, feet, fingers, toes, fingernails, and toenails. All findings within normal limits unless otherwise noted below.   Relevant physical exam findings are noted in the Assessment and Plan.  Exam of nails limited by presence of nail polish.   Left Mid Back 7 mm pink papule with telangiectasias        Right Upper Arm - Posterior Pink fleshy papule with buttonhole sign    Assessment & Plan   HISTORY OF BASAL CELL CARCINOMA OF THE SKIN. Right ant shoulder 11/19/2016. Left upper back 12/24/2016. - No evidence of recurrence today - Recommend regular full body skin exams - Recommend daily broad spectrum sunscreen SPF 30+ to sun-exposed areas, reapply every 2 hours as needed.  - Call if any new or changing lesions are noted between office visits   SKIN CANCER SCREENING PERFORMED TODAY.  ACTINIC DAMAGE - Chronic condition, secondary to cumulative UV/sun exposure - diffuse scaly erythematous  macules with underlying dyspigmentation - Recommend daily broad spectrum sunscreen SPF 30+ to sun-exposed areas, reapply every 2 hours as needed.  - Staying in the shade or wearing long sleeves, sun glasses (UVA+UVB protection) and wide brim hats (4-inch brim around the entire circumference of the hat) are also recommended for sun protection.  - Call for new or changing lesions.  LENTIGINES, SEBORRHEIC KERATOSES, HEMANGIOMAS - Benign normal skin lesions - Benign-appearing - Call for any changes  MELANOCYTIC NEVI - Tan-brown and/or pink-flesh-colored symmetric macules and papules - Benign appearing on exam today - Observation - Call clinic for new or changing moles - Recommend daily use of broad spectrum spf 30+ sunscreen to sun-exposed areas.  Exam of nails limited by presence of nail polish.    DERMATOFIBROMA Exam: Firm pink/brown papulenodule with dimple sign at right lateral ankle. Treatment Plan: A dermatofibroma is a benign growth possibly related to trauma, such as an insect bite, cut from shaving, or inflamed acne-type bump.  Treatment options to remove include shave or excision with resulting scar and risk of recurrence.  Since benign-appearing and not bothersome, will observe for now.    Idiopathic Guttate Hypomelanosis (IGH)  Exam: scattered small hypopigmented macules at legs  IGH is a benign chronic condition of sun-exposed skin, most commonly affecting the arms and legs. Cause is unknown but it can be a genetic trait. It is not related to vitiligo. There is no treatment. Recommend photoprotection and regular use of spf 30 or higher sunscreen which may help prevent the development of more white spots.  Treatment Plan:  Benign, observe.      Neoplasm of skin Left Mid Back  Skin / nail biopsy Type of biopsy: tangential   Informed consent: discussed and consent obtained   Anesthesia: the lesion was anesthetized in a standard fashion   Anesthesia comment:  Area  prepped with alcohol Anesthetic:  1% lidocaine w/ epinephrine 1-100,000 buffered w/ 8.4% NaHCO3 Instrument used: DermaBlade   Hemostasis achieved with: pressure and aluminum chloride   Outcome: patient tolerated procedure well   Post-procedure details: wound care instructions given   Post-procedure details comment:  Ointment and small bandage applied  Specimen 1 - Surgical pathology Differential Diagnosis: Nevus vs Melanoma vs Merkel Cell Carcinoma  Check Margins: No  Neurofibroma Right Upper Arm - Posterior  Benign-appearing. Suspect neurofibroma. Observation.  Call clinic for new or changing lesions.  Recommend daily use of broad spectrum spf 30+ sunscreen to sun-exposed areas.    Multiple benign nevi  Seborrheic keratoses  Lentigines  Dermatofibroma  Cherry angioma  Idiopathic guttate hypomelanosis   Return in about 1 year (around 08/27/2023) for TBSE, HxBCC.  I, Lawson Radar, CMA, am acting as scribe for Elie Goody, MD.   Documentation: I have reviewed the above documentation for accuracy and completeness, and I agree with the above.  Elie Goody, MD

## 2022-08-27 NOTE — Patient Instructions (Addendum)
Wound Care Instructions  Cleanse wound gently with soap and water once a day then pat dry with clean gauze. Apply a thin coat of Petrolatum (petroleum jelly, "Vaseline") over the wound (unless you have an allergy to this). We recommend that you use a new, sterile tube of Vaseline. Do not pick or remove scabs. Do not remove the yellow or white "healing tissue" from the base of the wound.  Cover the wound with fresh, clean, nonstick gauze and secure with paper tape. You may use Band-Aids in place of gauze and tape if the wound is small enough, but would recommend trimming much of the tape off as there is often too much. Sometimes Band-Aids can irritate the skin.  You should call the office for your biopsy report after 1 week if you have not already been contacted.  If you experience any problems, such as abnormal amounts of bleeding, swelling, significant bruising, significant pain, or evidence of infection, please call the office immediately.  FOR ADULT SURGERY PATIENTS: If you need something for pain relief you may take 1 extra strength Tylenol (acetaminophen) AND 2 Ibuprofen (200mg  each) together every 4 hours as needed for pain. (do not take these if you are allergic to them or if you have a reason you should not take them.) Typically, you may only need pain medication for 1 to 3 days.     Recommend daily broad spectrum sunscreen SPF 30+ to sun-exposed areas, reapply every 2 hours as needed. Call for new or changing lesions.  Staying in the shade or wearing long sleeves, sun glasses (UVA+UVB protection) and wide brim hats (4-inch brim around the entire circumference of the hat) are also recommended for sun protection.    Melanoma ABCDEs  Melanoma is the most dangerous type of skin cancer, and is the leading cause of death from skin disease.  You are more likely to develop melanoma if you: Have light-colored skin, light-colored eyes, or red or blond hair Spend a lot of time in the sun Tan  regularly, either outdoors or in a tanning bed Have had blistering sunburns, especially during childhood Have a close family member who has had a melanoma Have atypical moles or large birthmarks  Early detection of melanoma is key since treatment is typically straightforward and cure rates are extremely high if we catch it early.   The first sign of melanoma is often a change in a mole or a new dark spot.  The ABCDE system is a way of remembering the signs of melanoma.  A for asymmetry:  The two halves do not match. B for border:  The edges of the growth are irregular. C for color:  A mixture of colors are present instead of an even brown color. D for diameter:  Melanomas are usually (but not always) greater than 6mm - the size of a pencil eraser. E for evolution:  The spot keeps changing in size, shape, and color.  Please check your skin once per month between visits. You can use a small mirror in front and a large mirror behind you to keep an eye on the back side or your body.   If you see any new or changing lesions before your next follow-up, please call to schedule a visit.  Please continue daily skin protection including broad spectrum sunscreen SPF 30+ to sun-exposed areas, reapplying every 2 hours as needed when you're outdoors.   Staying in the shade or wearing long sleeves, sun glasses (UVA+UVB protection) and wide brim  hats (4-inch brim around the entire circumference of the hat) are also recommended for sun protection.    Due to recent changes in healthcare laws, you may see results of your pathology and/or laboratory studies on MyChart before the doctors have had a chance to review them. We understand that in some cases there may be results that are confusing or concerning to you. Please understand that not all results are received at the same time and often the doctors may need to interpret multiple results in order to provide you with the best plan of care or course of  treatment. Therefore, we ask that you please give [place] 2 business days to thoroughly review all your results before contacting the office for clarification. Should we see a critical lab result, you will be contacted sooner.   If You Need Anything After Your Visit  If you have any questions or concerns for your doctor, please call our main line at [phone] and press option 4 to reach your doctor's medical assistant. If no one answers, please leave a voicemail as directed and we will return your call as soon as possible. Messages left after 4 pm will be answered the following business day.   You may also send [place] a message via MyChart. We typically respond to MyChart messages within 1-2 business days.  For prescription refills, please ask your pharmacy to contact our office. Our fax number is [phone].  If you have an urgent issue when the clinic is closed that cannot wait until the next business day, you can page your doctor at the number below.    Please note that while we do our best to be available for urgent issues outside of office hours, we are not available 24/7.   If you have an urgent issue and are unable to reach [place], you may choose to seek medical care at your doctor's office, retail clinic, urgent care center, or emergency room.  If you have a medical emergency, please immediately call 911 or go to the emergency department.  Pager Numbers  - Dr. Gwen Pounds: [phone]  - Dr. Roseanne Reno: [phone]  - Dr. Katrinka Blazing: [phone]   In the event of inclement weather, please call our main line at [phone] for an update on the status of any delays or closures.  Dermatology Medication Tips: Please keep the boxes that topical medications come in in order to help keep track of the instructions about where and how to use these. Pharmacies typically print the medication instructions only on the boxes and not directly on the medication tubes.   If your medication is too expensive,  please contact our office at [phone] option 4 or send [place] a message through MyChart.   We are unable to tell what your co-pay for medications will be in advance as this is different depending on your insurance coverage. However, we may be able to find a substitute medication at lower cost or fill out paperwork to get insurance to cover a needed medication.   If a prior authorization is required to get your medication covered by your insurance company, please allow [place] 1-2 business days to complete this process.  Drug prices often vary depending on where the prescription is filled and some pharmacies may offer cheaper prices.  The website www.goodrx.com contains coupons for medications through different pharmacies. The prices here do not account for what the cost may be with help from insurance (it may be cheaper with your insurance), but the website can give you  the price if you did not use any insurance.  - You can print the associated coupon and take it with your prescription to the pharmacy.  - You may also stop by our office during regular business hours and pick up a GoodRx coupon card.  - If you need your prescription sent electronically to a different pharmacy, notify our office through [facility] or by phone at [phone] option 4.     Si Usted Necesita Algo Despus de Su Visita  Tambin puede enviarnos un mensaje a travs de Clinical cytogeneticist. Por lo general respondemos a los mensajes de MyChart en el transcurso de 1 a 2 das hbiles.  Para renovar recetas, por favor pida a su farmacia que se ponga en contacto con nuestra oficina. Annie Sable de fax es [place] [phone].  Si tiene un asunto urgente cuando la clnica est cerrada y que no puede esperar hasta el siguiente da hbil, puede llamar/localizar a su doctor(a) al nmero que aparece a continuacin.   Por favor, tenga en cuenta que aunque hacemos todo lo posible para estar disponibles para asuntos urgentes fuera del  horario de [place], no estamos disponibles las 24 horas del da, los 7 [address]  [address] de la [place].   Si tiene un problema urgente y no puede comunicarse con nosotros, puede optar por buscar atencin mdica  en el consultorio de su doctor(a), en una clnica privada, en un centro de atencin urgente o en una sala de emergencias.  Si tiene Engineer, drilling, por favor llame inmediatamente al 911 o vaya a la sala de emergencias.  Nmeros de bper  - Dr. Gwen Pounds: [phone]  - Dra. Roseanne Reno: [phone]  - Dr. Katrinka Blazing: [phone]   En caso de inclemencias del tiempo, por favor llame a Lacy Duverney principal al [phone] para una actualizacin sobre el [place] de cualquier retraso o cierre.  Consejos para la medicacin en dermatologa: Por favor, guarde las cajas en las que vienen los medicamentos de uso tpico para ayudarle a seguir las instrucciones sobre dnde y cmo usarlos. Las farmacias generalmente imprimen las instrucciones del medicamento slo en las cajas y no directamente en los tubos del [place].   Si su medicamento es muy caro, por favor, pngase en contacto con Rolm Gala llamando al [phone] y presione la opcin 4 o envenos un mensaje a travs de Clinical cytogeneticist.   No podemos decirle cul ser su copago por los medicamentos por adelantado ya que esto es diferente dependiendo de la cobertura de su seguro. Sin embargo, es posible que podamos encontrar un medicamento sustituto a Audiological scientist un formulario para que el seguro cubra el medicamento que se considera necesario.   Si se requiere una autorizacin previa para que su compaa de seguros [place] su medicamento, por favor permtanos de 1 a 2 das hbiles para completar [address]  Los precios de los medicamentos varan con frecuencia dependiendo del Environmental consultant de dnde se surte la receta y alguna farmacias pueden ofrecer precios ms baratos.  El sitio web www.goodrx.com tiene cupones para medicamentos de Engineer, civil (consulting). Los precios aqu no tienen en cuenta lo que podra costar con la ayuda del seguro (puede ser ms barato con su seguro), pero el sitio web puede darle el precio si no utiliz Tourist information centre manager.  - Puede imprimir el cupn correspondiente y llevarlo con su receta a la farmacia.  - Tambin puede pasar por nuestra oficina durante el horario de atencin regular y Education officer, museum una tarjeta de cupones de GoodRx.  - Si  necesita que su receta se enve electrnicamente a una farmacia diferente, informe a nuestra oficina a travs de MyChart de [place] o por telfono llamando al [phone] y presione la opcin 4.

## 2022-09-03 ENCOUNTER — Telehealth: Payer: Self-pay

## 2022-09-03 ENCOUNTER — Encounter: Payer: BC Managed Care – PPO | Admitting: Dermatology

## 2022-09-03 ENCOUNTER — Encounter: Payer: Self-pay | Admitting: Dermatology

## 2022-09-03 NOTE — Telephone Encounter (Signed)
-----   Message from Bertram sent at 09/03/2022  9:45 AM EDT ----- Diagnosis: Skin , left mid back MELANOCYTIC NEVUS, COMPOUND TYPE, IRRITATED  Plan: none, sent mychart message

## 2022-09-03 NOTE — Telephone Encounter (Signed)
Patient advised by phone and seen Dr. Michaelle Copas message directly to her.

## 2022-09-03 NOTE — Telephone Encounter (Signed)
LMOVM benign nevus. Call office if any questions.

## 2022-09-04 MED ORDER — HYDROCORTISONE 2.5 % EX OINT: TOPICAL_OINTMENT | CUTANEOUS | 0 refills | Status: AC

## 2022-09-09 ENCOUNTER — Encounter: Payer: BC Managed Care – PPO | Admitting: Dermatology

## 2022-09-10 ENCOUNTER — Encounter: Payer: BC Managed Care – PPO | Admitting: Dermatology

## 2022-12-16 ENCOUNTER — Ambulatory Visit (INDEPENDENT_AMBULATORY_CARE_PROVIDER_SITE_OTHER): Payer: No Typology Code available for payment source | Admitting: Podiatry

## 2022-12-16 ENCOUNTER — Encounter: Payer: Self-pay | Admitting: Podiatry

## 2022-12-16 VITALS — Ht 64.0 in | Wt 145.0 lb

## 2022-12-16 DIAGNOSIS — L989 Disorder of the skin and subcutaneous tissue, unspecified: Secondary | ICD-10-CM

## 2022-12-16 NOTE — Progress Notes (Unsigned)
  Subjective:  Patient ID: Laura Blackburn, female    DOB: December 14, 1970,  MRN: [id number]  Chief Complaint  Patient presents with   Plantar Warts    NP pt is here due to wart/ callous at the heel of her right foot, pt states its very painful and tried several methods to get rid of it with no relief.    52 y.o. female presents with the above complaint.  Patient presents with right heel benign skin lesion painful to touch is progressive gotten worse worse with ambulation worse with pressure is very painful.  She states that has been present for quite some time she has tried several over-the-counter methods none of which has helped pain scale 7 out of 10 dull aching nature without any relief.   Review of Systems: Negative except as noted in the HPI. Denies N/V/F/Ch.  Past Medical History:  Diagnosis Date   Anemia    iron deficiency   Arthritis    Basal cell carcinoma 11/19/2016   right ant shoulder/superficial   Basal cell carcinoma 12/24/2016   left upper back/excision   Chicken pox    Depression 2010   Postpartum, pt no longer feels depression   Ectopic pregnancy 2008   Family history of adverse reaction to anesthesia    Mom has hx of PONV   GERD (gastroesophageal reflux disease)    History of kidney stones    Hypertension    PONV (postoperative nausea and vomiting)     Current Outpatient Medications:    Ascorbic Acid (VITAMIN C) 1000 MG tablet, Take 1,000 mg by mouth daily., Disp: , Rfl:    gabapentin (NEURONTIN) 300 MG capsule, Take 1 capsule (300 mg total) by mouth at bedtime. (Patient taking differently: Take 300 mg by mouth 2 (two) times daily.), Disp: 90 capsule, Rfl: 3   hydrocortisone 2.5 % ointment, Apply to aa QD-BID PRN., Disp: 28.35 g, Rfl: 0   Multiple Vitamin (MULTIVITAMIN) tablet, Take 1 tablet by mouth daily., Disp: , Rfl:   Social History   Tobacco Use  Smoking Status Never  Smokeless Tobacco Never    Allergies  Allergen Reactions    Oxycodone-Acetaminophen     REACTION: Hallucinations   Objective:  There were no vitals filed for this visit. Body mass index is 24.89 kg/m. Constitutional Well developed. Well nourished.  Vascular Dorsalis pedis pulses palpable bilaterally. Posterior tibial pulses palpable bilaterally. Capillary refill normal to all digits.  No cyanosis or clubbing noted. Pedal hair growth normal.  Neurologic Normal speech. Oriented to person, place, and time. Epicritic sensation to light touch grossly present bilaterally.  Dermatologic Right heel benign skin lesion with some pinpoint bleeding and with possible central nucleated core noted.  Mild pain on palpation  Orthopedic: Normal joint ROM without pain or crepitus bilaterally. No visible deformities. No bony tenderness.   Radiographs: None Assessment:   1. Benign skin lesion    Plan:  Patient was evaluated and treated and all questions answered.  Right heel benign skin lesion -All questions and concerns were discussed with the patient in extensive detail --Lesion was debrided today without complications. Hemostasis was achieved and the area was cleaned. Cantharone was applied followed by an occlusive bandage. Post procedure complications were discussed. Monitor for signs or symptoms of infection and directed to call the office mainly should any occur.   No follow-ups on file.

## 2022-12-26 ENCOUNTER — Telehealth: Payer: Self-pay | Admitting: Podiatry

## 2022-12-26 NOTE — Telephone Encounter (Signed)
Pt called and the area that was scraped on 12/3 and it has gotten worse and she is in a lot of pain. She is scheduled to see you 12/17( next Tuesday) and I did tell Dr Allena Katz is not in the [place] office today or Monday. Pt stated she could not come to [facility] today

## 2022-12-30 ENCOUNTER — Ambulatory Visit: Payer: No Typology Code available for payment source | Admitting: Podiatry

## 2022-12-30 VITALS — Ht 64.0 in | Wt 145.0 lb

## 2022-12-30 DIAGNOSIS — L989 Disorder of the skin and subcutaneous tissue, unspecified: Secondary | ICD-10-CM | POA: Diagnosis not present

## 2022-12-30 DIAGNOSIS — Z01818 Encounter for other preprocedural examination: Secondary | ICD-10-CM

## 2022-12-30 NOTE — Progress Notes (Unsigned)
Subjective:  Patient ID: Laura Blackburn, female    DOB: 06-29-1970,  MRN: [id number]  Chief Complaint  Patient presents with   Foot Pain    Pt here to f/u on right foot pain to the bottom of her foot, states the area is more painful then before, states its hard for her to walk on.    52 y.o. female presents with the above complaint.  Patient presents for follow-up of right heel benign skin lesion she states that is still about the same has not gotten better.  She would like to discuss surgical options   Review of Systems: Negative except as noted in the HPI. Denies N/V/F/Ch.  Past Medical History:  Diagnosis Date   Anemia    iron deficiency   Arthritis    Basal cell carcinoma 11/19/2016   right ant shoulder/superficial   Basal cell carcinoma 12/24/2016   left upper back/excision   Chicken pox    Depression 2010   Postpartum, pt no longer feels depression   Ectopic pregnancy 2008   Family history of adverse reaction to anesthesia    Mom has hx of PONV   GERD (gastroesophageal reflux disease)    History of kidney stones    Hypertension    PONV (postoperative nausea and vomiting)     Current Outpatient Medications:    Ascorbic Acid (VITAMIN C) 1000 MG tablet, Take 1,000 mg by mouth daily., Disp: , Rfl:    gabapentin (NEURONTIN) 300 MG capsule, Take 1 capsule (300 mg total) by mouth at bedtime. (Patient taking differently: Take 300 mg by mouth 2 (two) times daily.), Disp: 90 capsule, Rfl: 3   hydrocortisone 2.5 % ointment, Apply to aa QD-BID PRN., Disp: 28.35 g, Rfl: 0   Multiple Vitamin (MULTIVITAMIN) tablet, Take 1 tablet by mouth daily., Disp: , Rfl:   Social History   Tobacco Use  Smoking Status Never  Smokeless Tobacco Never    Allergies  Allergen Reactions   Oxycodone-Acetaminophen     REACTION: Hallucinations   Objective:  There were no vitals filed for this visit. Body mass index is 24.89 kg/m. Constitutional Well developed. Well nourished.   Vascular Dorsalis pedis pulses palpable bilaterally. Posterior tibial pulses palpable bilaterally. Capillary refill normal to all digits.  No cyanosis or clubbing noted. Pedal hair growth normal.  Neurologic Normal speech. Oriented to person, place, and time. Epicritic sensation to light touch grossly present bilaterally.  Dermatologic Right heel benign skin lesion with some pinpoint bleeding and with possible central nucleated core noted.  Mild pain on palpation  Orthopedic: Normal joint ROM without pain or crepitus bilaterally. No visible deformities. No bony tenderness.   Radiographs: None Assessment:   No diagnosis found.  Plan:  Patient was evaluated and treated and all questions answered.  Right heel benign skin lesion -All questions and concerns were discussed with the patient in extensive detail -- Given the amount of lesion that is present I believe patient will benefit from surgical excision.  She failed Cantharone therapy.  She would benefit surgical excision of right heel benign skin lesion.  Discussed my surgical plan with the patient extensive detail she would like to proceed with surgery. -Informed surgical risk consent was reviewed and read aloud to the patient.  I reviewed the films.  I have discussed my findings with the patient in great detail.  I have discussed all risks including but not limited to infection, stiffness, scarring, limp, disability, deformity, damage to blood vessels and nerves, numbness, poor healing,  need for braces, arthritis, chronic pain, amputation, death.  All benefits and realistic expectations discussed in great detail.  I have made no promises as to the outcome.  I have provided realistic expectations.  I have offered the patient a 2nd opinion, which they have declined and assured me they preferred to proceed despite the risks    No follow-ups on file.

## 2023-01-22 ENCOUNTER — Telehealth: Payer: Self-pay

## 2023-01-22 ENCOUNTER — Encounter: Payer: Self-pay | Admitting: Dermatology

## 2023-01-22 ENCOUNTER — Ambulatory Visit: Payer: No Typology Code available for payment source | Admitting: Dermatology

## 2023-01-22 ENCOUNTER — Ambulatory Visit
Admission: RE | Admit: 2023-01-22 | Discharge: 2023-01-22 | Disposition: A | Discharge status: Home / Self Care | Payer: No Typology Code available for payment source | Source: Ambulatory Visit | Attending: Family Medicine | Admitting: Family Medicine

## 2023-01-22 DIAGNOSIS — Z1231 Encounter for screening mammogram for malignant neoplasm of breast: Secondary | ICD-10-CM | POA: Insufficient documentation

## 2023-01-22 DIAGNOSIS — D2371 Other benign neoplasm of skin of right lower limb, including hip: Secondary | ICD-10-CM

## 2023-01-22 DIAGNOSIS — D485 Neoplasm of uncertain behavior of skin: Secondary | ICD-10-CM

## 2023-01-22 NOTE — Telephone Encounter (Signed)
 Laura Blackburn called to cancel her surgery with Dr. Allena Katz on 02/16/2023. She stated her foot is no longer bothering her. She will call back if she needs [place]. Notified Dr. Allena Katz and Aram Beecham at [facility].

## 2023-01-22 NOTE — Progress Notes (Signed)
   Follow-Up Visit   Subjective  Laura Blackburn is a 53 y.o. female who presents for the following: spot on R foot x 2 months, pt was being seen by [facility] being treated for poro keratosis, pt states after 2 treatments LN2 spot fell off last night, last LN2 treatment done 12/30/22.  The patient has spots, moles and lesions to be evaluated, some may be new or changing and the patient may have concern these could be cancer.   The following portions of the chart were reviewed this encounter and updated as appropriate: medications, allergies, medical history  Review of Systems:  No other skin or systemic complaints except as noted in HPI or Assessment and Plan.  Objective  Well appearing patient in no apparent distress; mood and affect are within normal limits.   A focused examination was performed of the following areas: R heel  Relevant exam findings are noted in the Assessment and Plan.        Assessment & Plan   Wart vs Porokeratosis vs Other Resolving from LN2 tx x2 at [facility] Progress notes reviewed from [facility] R heel Exam: See photos. Keratotic papule with cerebriform portion surrounded by Collarette of scale from healing from treatment at podiatrist. Patient kept scale that fell off yesterday  Treatment Plan: Benign appearing, may be verruca vs porokeratosis. observed for recurrence. Return to clinic with any changes. Shave removal could be done for wart if needed   Return if symptoms worsen or fail to improve.  I, Jacquelynn Vera, CMA, am acting as scribe for Boneta Sharps, MD .   Documentation: I have reviewed the above documentation for accuracy and completeness, and I agree with the above.  Boneta Sharps, MD

## 2023-01-22 NOTE — Patient Instructions (Signed)

## 2023-01-23 ENCOUNTER — Encounter: Payer: Self-pay | Admitting: Family Medicine

## 2023-01-26 ENCOUNTER — Other Ambulatory Visit: Payer: Self-pay | Admitting: Family Medicine

## 2023-01-26 DIAGNOSIS — R928 Other abnormal and inconclusive findings on diagnostic imaging of breast: Secondary | ICD-10-CM

## 2023-01-30 ENCOUNTER — Ambulatory Visit
Admission: RE | Admit: 2023-01-30 | Discharge: 2023-01-30 | Disposition: A | Discharge status: Home / Self Care | Payer: No Typology Code available for payment source | Source: Ambulatory Visit | Attending: Family Medicine | Admitting: Family Medicine

## 2023-01-30 DIAGNOSIS — R928 Other abnormal and inconclusive findings on diagnostic imaging of breast: Secondary | ICD-10-CM | POA: Diagnosis present

## 2023-02-24 ENCOUNTER — Encounter: Payer: No Typology Code available for payment source | Admitting: Podiatry

## 2023-03-10 ENCOUNTER — Encounter: Payer: No Typology Code available for payment source | Admitting: Podiatry

## 2023-07-31 IMAGING — MG MM DIGITAL SCREENING BILAT W/ TOMO AND CAD
8 series · 8 of 24 positions shown · non-contrast
Comparison: Previous exam(s).

CLINICAL DATA: Screening.

EXAM:
DIGITAL SCREENING BILATERAL MAMMOGRAM WITH TOMOSYNTHESIS AND CAD
TECHNIQUE: Bilateral screening digital craniocaudal and mediolateral oblique
mammograms were obtained. Bilateral screening digital breast
tomosynthesis was performed. The images were evaluated with
computer-aided detection.

[L MLO synth-2D]
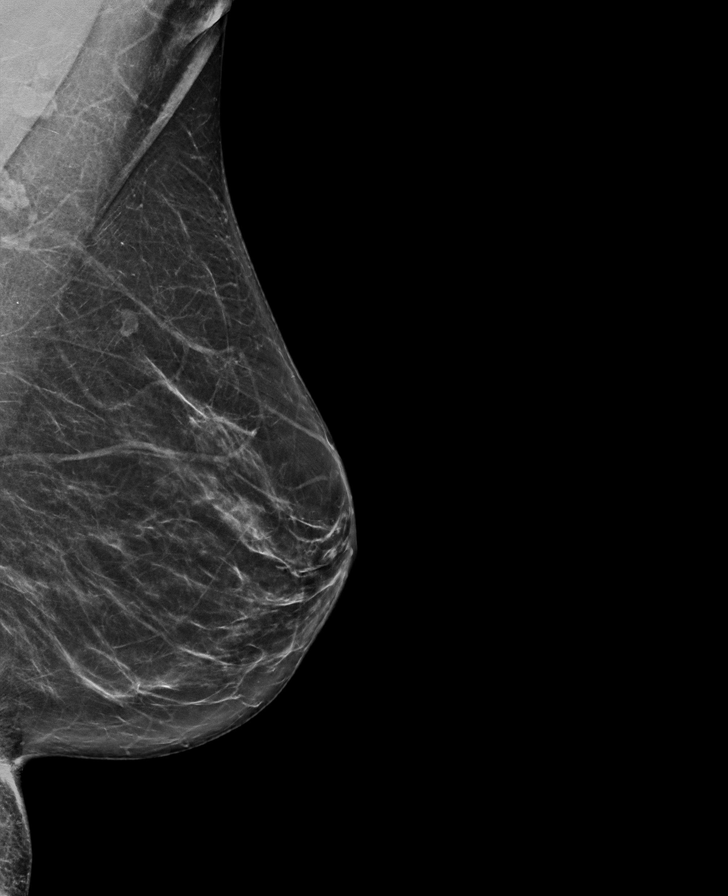

[R MLO synth-2D]
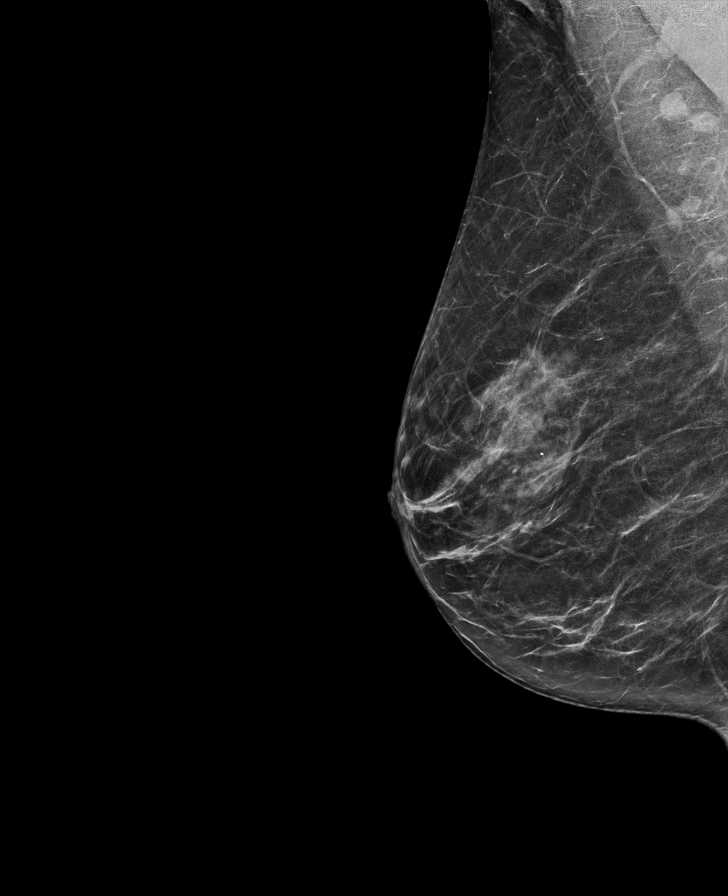

[L CC synth-2D]
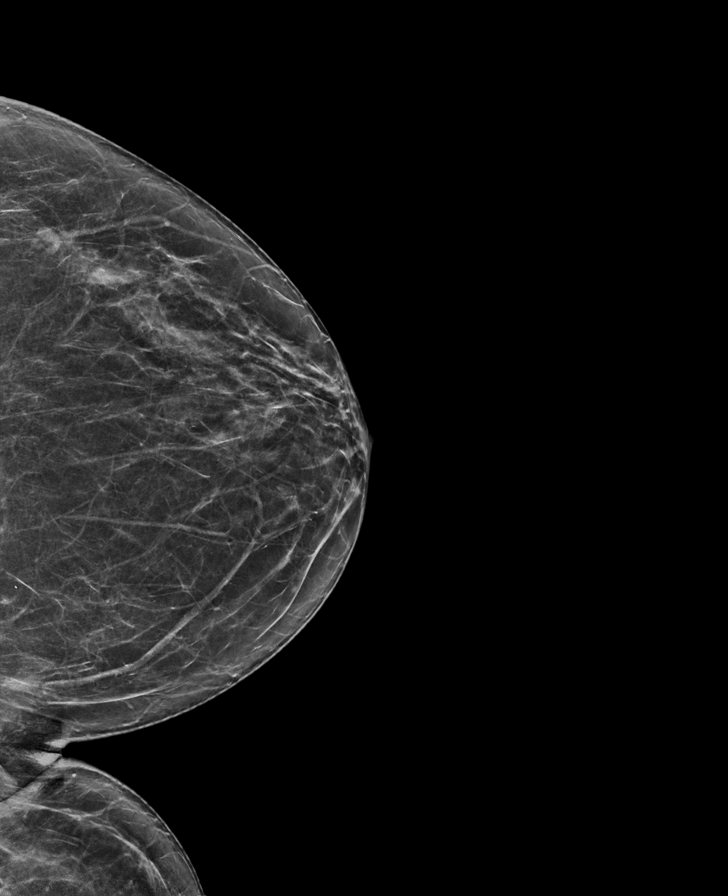

[R CC synth-2D]
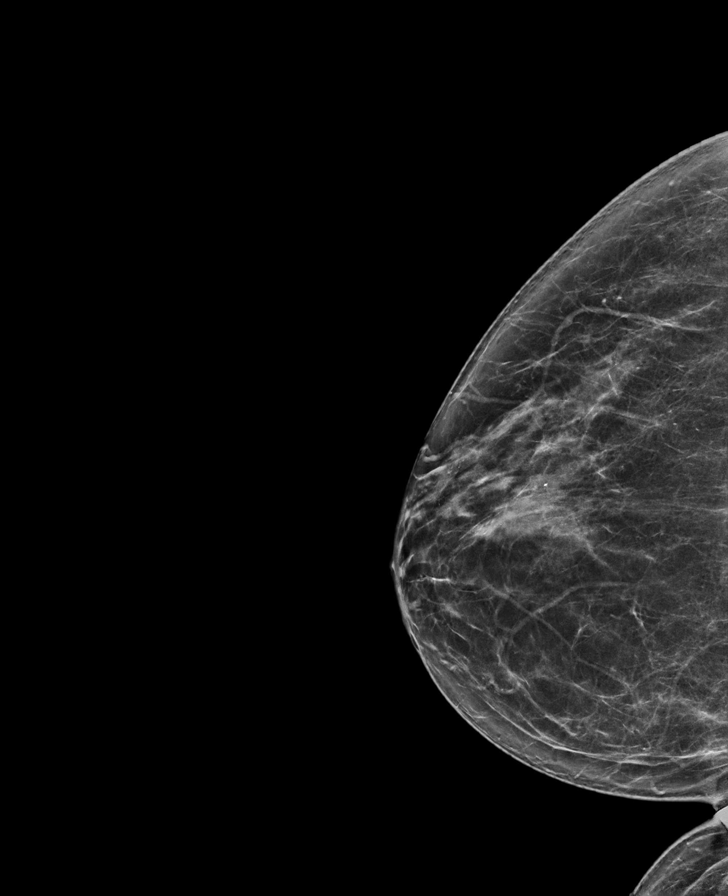

[L MLO tomo · tomo slice 37/72.0]
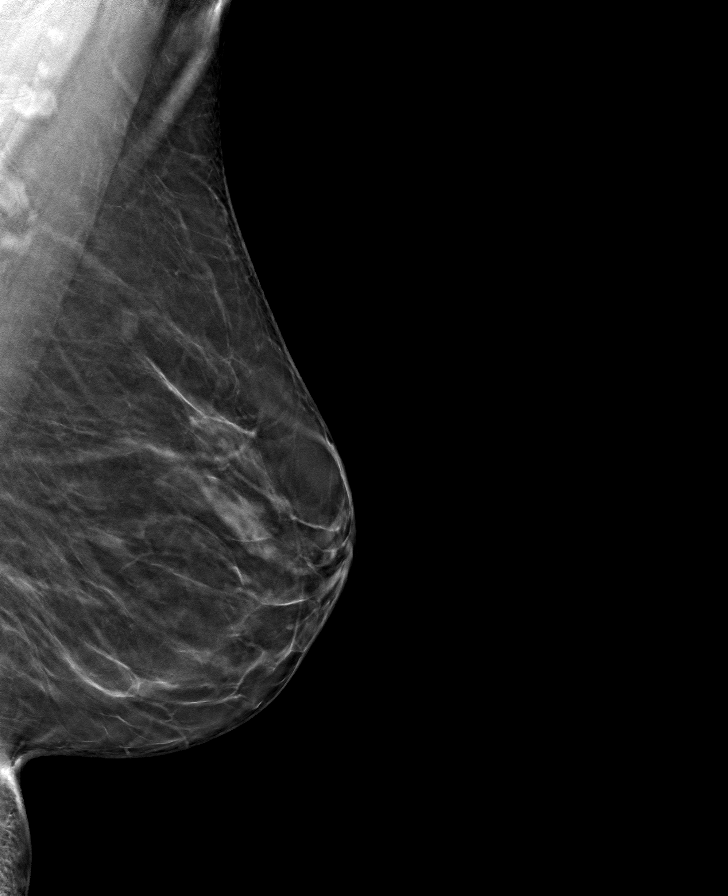

[R CC tomo · tomo slice 35/70.0]
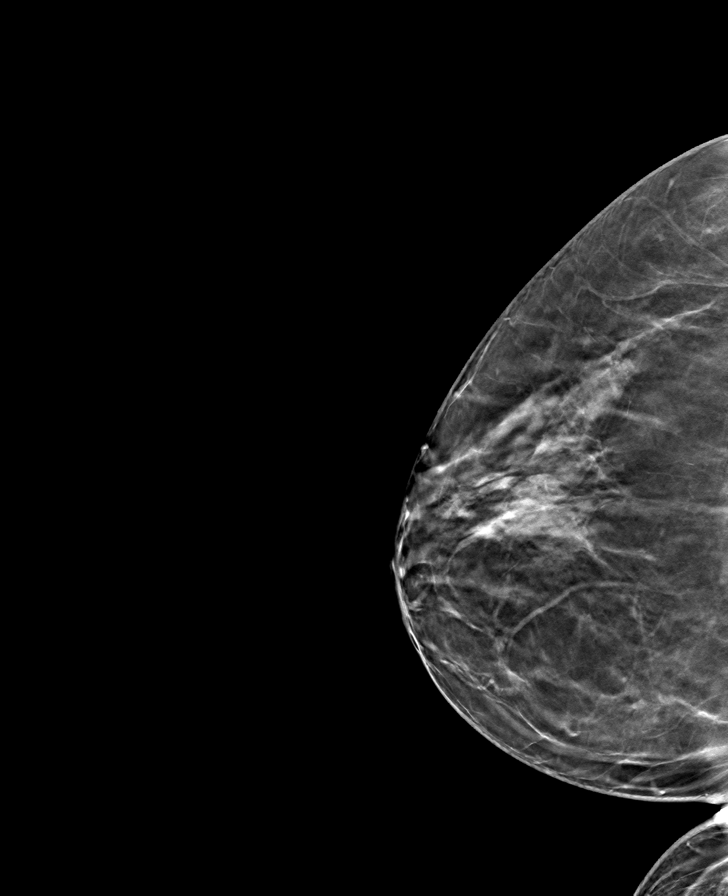

[R MLO tomo · tomo slice 35/69.0]
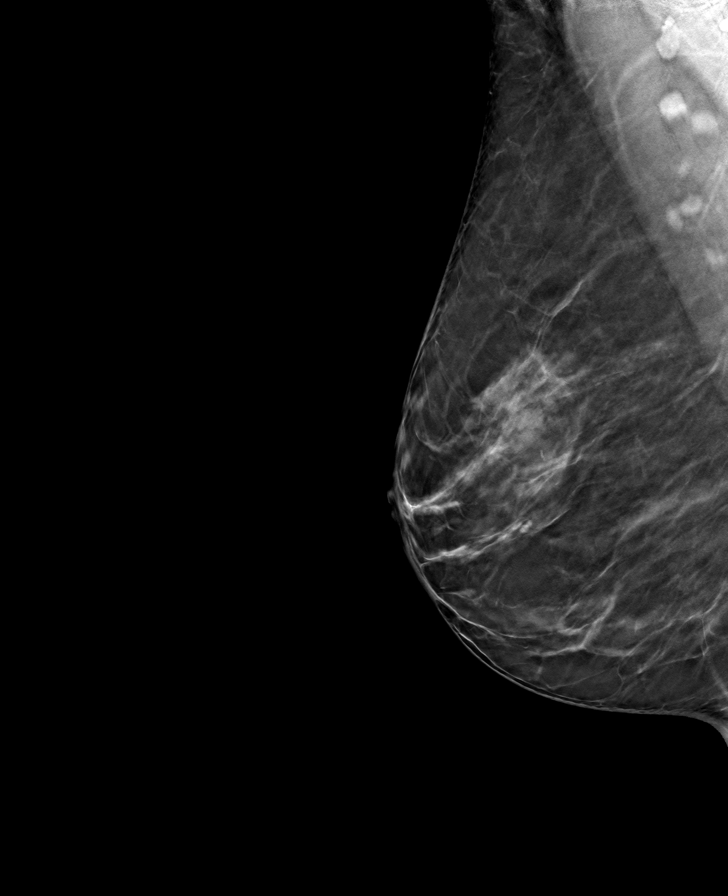

[L CC tomo · tomo slice 33/64.0]
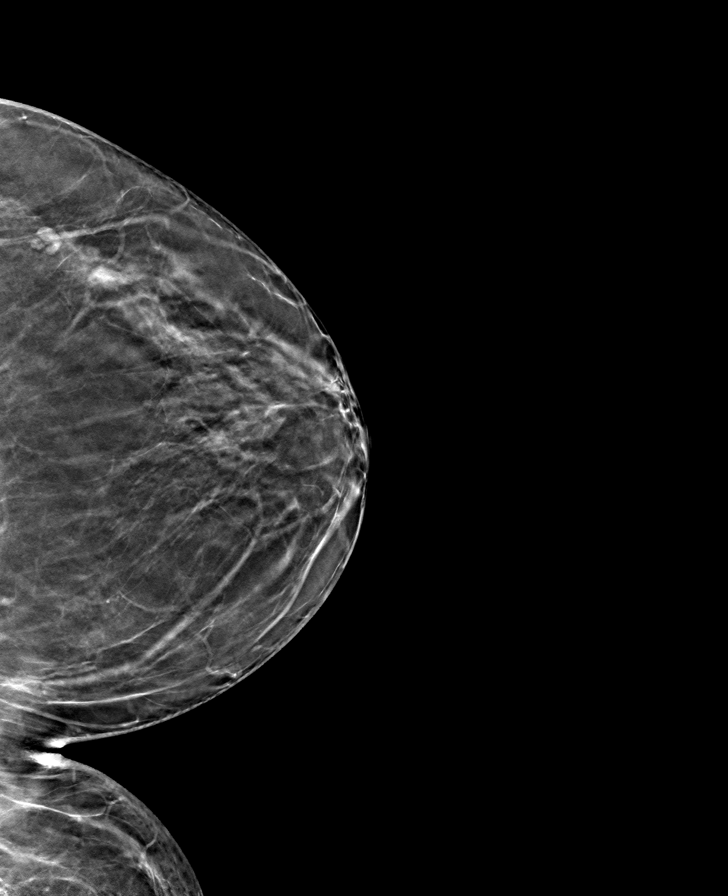

[8 of 24 positions shown; findings below may reference images not displayed]

ACR Breast Density Category b: There are scattered areas of
fibroglandular density.
FINDINGS: There are no findings suspicious for malignancy.
IMPRESSION: No mammographic evidence of malignancy. A result letter of this
screening mammogram will be mailed directly to the patient.

RECOMMENDATION:
Screening mammogram in one year. (Code:51-O-LD2)

BI-RADS CATEGORY  1: Negative.

## 2023-08-27 ENCOUNTER — Ambulatory Visit: Payer: No Typology Code available for payment source | Admitting: Dermatology

## 2023-08-27 ENCOUNTER — Encounter: Payer: Self-pay | Admitting: Dermatology

## 2023-08-27 DIAGNOSIS — D239 Other benign neoplasm of skin, unspecified: Secondary | ICD-10-CM

## 2023-08-27 DIAGNOSIS — L568 Other specified acute skin changes due to ultraviolet radiation: Secondary | ICD-10-CM

## 2023-08-27 DIAGNOSIS — Z1283 Encounter for screening for malignant neoplasm of skin: Secondary | ICD-10-CM | POA: Diagnosis not present

## 2023-08-27 DIAGNOSIS — L309 Dermatitis, unspecified: Secondary | ICD-10-CM

## 2023-08-27 DIAGNOSIS — L578 Other skin changes due to chronic exposure to nonionizing radiation: Secondary | ICD-10-CM | POA: Diagnosis not present

## 2023-08-27 DIAGNOSIS — L814 Other melanin hyperpigmentation: Secondary | ICD-10-CM | POA: Diagnosis not present

## 2023-08-27 DIAGNOSIS — D2371 Other benign neoplasm of skin of right lower limb, including hip: Secondary | ICD-10-CM

## 2023-08-27 DIAGNOSIS — D1801 Hemangioma of skin and subcutaneous tissue: Secondary | ICD-10-CM

## 2023-08-27 DIAGNOSIS — L821 Other seborrheic keratosis: Secondary | ICD-10-CM

## 2023-08-27 DIAGNOSIS — D2271 Melanocytic nevi of right lower limb, including hip: Secondary | ICD-10-CM

## 2023-08-27 DIAGNOSIS — Z85828 Personal history of other malignant neoplasm of skin: Secondary | ICD-10-CM

## 2023-08-27 DIAGNOSIS — W908XXA Exposure to other nonionizing radiation, initial encounter: Secondary | ICD-10-CM

## 2023-08-27 DIAGNOSIS — D229 Melanocytic nevi, unspecified: Secondary | ICD-10-CM

## 2023-08-27 MED ORDER — TRETINOIN 0.025 % EX CREA: TOPICAL_CREAM | CUTANEOUS | 11 refills | Status: AC

## 2023-08-27 NOTE — Progress Notes (Addendum)
 Follow-Up Visit   Subjective  Laura Blackburn is a 53 y.o. female who presents for the following: Skin Cancer Screening and Full Body Skin Exam. Hx of BCCs.   Would like prescription for tretinoin . Has used in the past for cosmetic reasons.   The patient presents for Total-Body Skin Exam (TBSE) for skin cancer screening and mole check. The patient has spots, moles and lesions to be evaluated, some may be new or changing and the patient may have concern these could be cancer.    The following portions of the chart were reviewed this encounter and updated as appropriate: medications, allergies, medical history  Review of Systems:  No other skin or systemic complaints except as noted in HPI or Assessment and Plan.  Objective  Well appearing patient in no apparent distress; mood and affect are within normal limits.  A full examination was performed including scalp, head, eyes, ears, nose, lips, neck, chest, axillae, abdomen, back, buttocks, bilateral upper extremities, bilateral lower extremities, hands, feet, fingers, toes, fingernails, and toenails. All findings within normal limits unless otherwise noted below.   Relevant physical exam findings are noted in the Assessment and Plan.  Exam of nails limited by presence of nail polish.    Assessment & Plan   SKIN CANCER SCREENING PERFORMED TODAY.  HISTORY OF BASAL CELL CARCINOMA OF THE SKIN. Right ant shoulder 11/19/2016. Left upper back 12/24/2016.  - No evidence of recurrence today - Recommend regular full body skin exams - Recommend daily broad spectrum sunscreen SPF 30+ to sun-exposed areas, reapply every 2 hours as needed.  - Call if any new or changing lesions are noted between office visits   ACTINIC DAMAGE - Chronic condition, secondary to cumulative UV/sun exposure - diffuse scaly erythematous macules with underlying dyspigmentation - Recommend daily broad spectrum sunscreen SPF 30+ to sun-exposed areas, reapply  every 2 hours as needed.  - Staying in the shade or wearing long sleeves, sun glasses (UVA+UVB protection) and wide brim hats (4-inch brim around the entire circumference of the hat) are also recommended for sun protection.  - Call for new or changing lesions. - start tretinoin , thin layer to face except eyelids, may cause irritation, increase to nightly as tolerated  LENTIGINES, SEBORRHEIC KERATOSES, HEMANGIOMAS - Benign normal skin lesions - Benign-appearing - Call for any changes  MELANOCYTIC NEVI - Tan-brown and/or pink-flesh-colored symmetric macules and papules - Tan macule at R plantar foot at instep  - Benign appearing on exam today - Observation - Call clinic for new or changing moles - Recommend daily use of broad spectrum spf 30+ sunscreen to sun-exposed areas.  - Check nails when remove polish.   DERMATOFIBROMA Exam: Firm pink/brown papulenodule with dimple sign at right lateral ankle. Treatment Plan: A dermatofibroma is a benign growth possibly related to trauma, such as an insect bite, cut from shaving, or inflamed acne-type bump.  Treatment options to remove include shave or excision with resulting scar and risk of recurrence.  Since benign-appearing and not bothersome, will observe for now.   LENTIGINES Exam: scattered tan macules Due to sun exposure Treatment Plan: Start Tretinoin  0.025% cream apply pea-sized amount to face at bedtime, wash off in morning.   Topical retinoid medications like tretinoin /Retin-A , adapalene/Differin, tazarotene/Fabior, and Epiduo/Epiduo Forte can cause dryness and irritation when first started. Only apply a pea-sized amount to the entire affected area. Avoid applying it around the eyes, edges of mouth and creases at the nose. If you experience irritation, use a good moisturizer  first and/or apply the medicine less often. If you are doing well with the medicine, you can increase how often you use it until you are applying every night. Be  careful with sun protection while using this medication as it can make you sensitive to the sun. This medicine should not be used by pregnant women.   Phototoxic dermatitis Exam: red blanchable confluent patches of sun-exposed arms and chest  Plan: moisturizer, sun protection   LENTIGINES   MULTIPLE BENIGN NEVI   ACTINIC ELASTOSIS   SEBORRHEIC KERATOSES   CHERRY ANGIOMA   DERMATOFIBROMA   PHOTOTOXIC DERMATITIS   Return in about 1 year (around 08/26/2024) for TBSE, HxBCC.  I, Jill Parcell, CMA, am acting as scribe for Boneta Sharps, MD.   Documentation: I have reviewed the above documentation for accuracy and completeness, and I agree with the above.  Boneta Sharps, MD

## 2023-08-27 NOTE — Patient Instructions (Addendum)
 Start Tretinoin  0.025% cream apply pea-sized amount to face at bedtime, wash off in morning.   Topical retinoid medications like tretinoin /Retin-A , adapalene/Differin, tazarotene/Fabior, and Epiduo/Epiduo Forte can cause dryness and irritation when first started. Only apply a pea-sized amount to the entire affected area. Avoid applying it around the eyes, edges of mouth and creases at the nose. If you experience irritation, use a good moisturizer first and/or apply the medicine less often. If you are doing well with the medicine, you can increase how often you use it until you are applying every night. Be careful with sun protection while using this medication as it can make you sensitive to the sun. This medicine should not be used by pregnant women.      Recommend daily broad spectrum sunscreen SPF 30+ to sun-exposed areas, reapply every 2 hours as needed. Call for new or changing lesions.  Staying in the shade or wearing long sleeves, sun glasses (UVA+UVB protection) and wide brim hats (4-inch brim around the entire circumference of the hat) are also recommended for sun protection.      Melanoma ABCDEs  Melanoma is the most dangerous type of skin cancer, and is the leading cause of death from skin disease.  You are more likely to develop melanoma if you: Have light-colored skin, light-colored eyes, or red or blond hair Spend a lot of time in the sun Tan regularly, either outdoors or in a tanning bed Have had blistering sunburns, especially during childhood Have a close family member who has had a melanoma Have atypical moles or large birthmarks  Early detection of melanoma is key since treatment is typically straightforward and cure rates are extremely high if we catch it early.   The first sign of melanoma is often a change in a mole or a new dark spot.  The ABCDE system is a way of remembering the signs of melanoma.  A for asymmetry:  The two halves do not match. B for border:  The  edges of the growth are irregular. C for color:  A mixture of colors are present instead of an even brown color. D for diameter:  Melanomas are usually (but not always) greater than 6mm - the size of a pencil eraser. E for evolution:  The spot keeps changing in size, shape, and color.  Please check your skin once per month between visits. You can use a small mirror in front and a large mirror behind you to keep an eye on the back side or your body.   If you see any new or changing lesions before your next follow-up, please call to schedule a visit.  Please continue daily skin protection including broad spectrum sunscreen SPF 30+ to sun-exposed areas, reapplying every 2 hours as needed when you're outdoors.   Staying in the shade or wearing long sleeves, sun glasses (UVA+UVB protection) and wide brim hats (4-inch brim around the entire circumference of the hat) are also recommended for sun protection.      Due to recent changes in healthcare laws, you may see results of your pathology and/or laboratory studies on MyChart before the doctors have had a chance to review them. We understand that in some cases there may be results that are confusing or concerning to you. Please understand that not all results are received at the same time and often the doctors may need to interpret multiple results in order to provide you with the best plan of care or course of treatment. Therefore, we ask that  you please give us  2 business days to thoroughly review all your results before contacting the office for clarification. Should we see a critical lab result, you will be contacted sooner.   If You Need Anything After Your Visit  If you have any questions or concerns for your doctor, please call our main line at [phone] and press option 4 to reach your doctor's medical assistant. If no one answers, please leave a voicemail as directed and we will return your call as soon as possible. Messages left after 4 pm  will be answered the following business day.   You may also send us  a message via MyChart. We typically respond to MyChart messages within 1-2 business days.  For prescription refills, please ask your pharmacy to contact our office. Our fax number is [phone].  If you have an urgent issue when the clinic is closed that cannot wait until the next business day, you can page your doctor at the number below.    Please note that while we do our best to be available for urgent issues outside of office hours, we are not available 24/7.   If you have an urgent issue and are unable to reach us , you may choose to seek medical care at your doctor's office, retail clinic, urgent care center, or emergency room.  If you have a medical emergency, please immediately call 911 or go to the emergency department.  Pager Numbers  - Dr. Hester: [phone]  - Dr. Jackquline: [phone]  - Dr. Claudene: [phone]   In the event of inclement weather, please call our main line at [phone] for an update on the status of any delays or closures.  Dermatology Medication Tips: Please keep the boxes that topical medications come in in order to help keep track of the instructions about where and how to use these. Pharmacies typically print the medication instructions only on the boxes and not directly on the medication tubes.   If your medication is too expensive, please contact our office at [phone] option 4 or send us  a message through MyChart.   We are unable to tell what your co-pay for medications will be in advance as this is different depending on your insurance coverage. However, we may be able to find a substitute medication at lower cost or fill out paperwork to get insurance to cover a needed medication.   If a prior authorization is required to get your medication covered by your insurance company, please allow us  1-2 business days to complete this process.  Drug prices often vary  depending on where the prescription is filled and some pharmacies may offer cheaper prices.  The website www.goodrx.com contains coupons for medications through different pharmacies. The prices here do not account for what the cost may be with help from insurance (it may be cheaper with your insurance), but the website can give you the price if you did not use any insurance.  - You can print the associated coupon and take it with your prescription to the pharmacy.  - You may also stop by our office during regular business hours and pick up a GoodRx coupon card.  - If you need your prescription sent electronically to a different pharmacy, notify our office through [facility] or by phone at [phone] option 4.     Si Usted Necesita Algo Despus de Su Visita  Tambin puede enviarnos un mensaje a travs de Clinical cytogeneticist. Por lo general respondemos a los Public relations account executive  transcurso de 1 a 2 das hbiles.  Para renovar recetas, por favor pida a su farmacia que se ponga en contacto con nuestra oficina. Randi lakes de fax es [place] [phone].  Si tiene un asunto urgente cuando la clnica est cerrada y que no puede esperar hasta el siguiente da hbil, puede llamar/localizar a su doctor(a) al nmero que aparece a continuacin.   Por favor, tenga en cuenta que aunque hacemos todo lo posible para estar disponibles para asuntos urgentes fuera del horario de [place], no estamos disponibles las 24 horas del da, los 7 [address]  [address] de la [place].   Si tiene un problema urgente y no puede comunicarse con nosotros, puede optar por buscar atencin mdica  en el consultorio de su doctor(a), en una clnica privada, en un centro de atencin urgente o en una sala de emergencias.  Si tiene Engineer, drilling, por favor llame inmediatamente al 911 o vaya a la sala de emergencias.  Nmeros de bper  - Dr. Hester: [phone]  - Dra. Jackquline: [phone]  - Dr. Claudene: [phone]   En caso de  inclemencias del tiempo, por favor llame a landry capes principal al [phone] para una actualizacin sobre el [place] de cualquier retraso o cierre.  Consejos para la medicacin en dermatologa: Por favor, guarde las cajas en las que vienen los medicamentos de uso tpico para ayudarle a seguir las instrucciones sobre dnde y cmo usarlos. Las farmacias generalmente imprimen las instrucciones del medicamento slo en las cajas y no directamente en los tubos del [place].   Si su medicamento es muy caro, por favor, pngase en contacto con landry rieger llamando al [phone] y presione la opcin 4 o envenos un mensaje a travs de Clinical cytogeneticist.   No podemos decirle cul ser su copago por los medicamentos por adelantado ya que esto es diferente dependiendo de la cobertura de su seguro. Sin embargo, es posible que podamos encontrar un medicamento sustituto a Audiological scientist un formulario para que el seguro cubra el medicamento que se considera necesario.   Si se requiere una autorizacin previa para que su compaa de seguros malta su medicamento, por favor permtanos de 1 a 2 das hbiles para completar este proceso.  Los precios de los medicamentos varan con frecuencia dependiendo del Environmental consultant de dnde se surte la receta y alguna farmacias pueden ofrecer precios ms baratos.  El sitio web www.goodrx.com tiene cupones para medicamentos de Health and safety inspector. Los precios aqu no tienen en cuenta lo que podra costar con la ayuda del seguro (puede ser ms barato con su seguro), pero el sitio web puede darle el precio si no utiliz Tourist information centre manager.  - Puede imprimir el cupn correspondiente y llevarlo con su receta a la farmacia.  - Tambin puede pasar por nuestra oficina durante el horario de atencin regular y Education officer, museum una tarjeta de cupones de GoodRx.  - Si necesita que su receta se enve electrnicamente a una farmacia diferente, informe a nuestra oficina a travs de MyChart de [place] o  por telfono llamando al [phone] y presione la opcin 4.

## 2023-11-23 ENCOUNTER — Ambulatory Visit: Admitting: Urology

## 2023-11-23 VITALS — BP 137/76 | HR 78 | Ht 64.0 in | Wt 146.2 lb

## 2023-11-23 DIAGNOSIS — R3915 Urgency of urination: Secondary | ICD-10-CM | POA: Diagnosis not present

## 2023-11-23 LAB — URINALYSIS, COMPLETE
Bilirubin, UA: NEGATIVE
Glucose, UA: NEGATIVE
Ketones, UA: NEGATIVE
Nitrite, UA: NEGATIVE
Protein,UA: NEGATIVE
Specific Gravity, UA: 1.015 (ref 1.005–1.030)
Urobilinogen, Ur: 0.2 mg/dL (ref 0.2–1.0)
pH, UA: 6 (ref 5.0–7.5)

## 2023-11-23 LAB — MICROSCOPIC EXAMINATION: Epithelial Cells (non renal): >10 /HPF — AB (ref 0–10)

## 2023-11-23 NOTE — Progress Notes (Signed)
 11/23/2023 3:18 PM   Laura Blackburn [birth year] [id number]  Referring provider: Lisbeth Pac, PA [address] [place],  [place] [address]  Chief Complaint  Patient presents with   New Patient (Initial Visit)   Establish Care   Urinary Frequency    HPI: I was consulted to assess the patient's voiding dysfunction over the last 2 weeks.  She went to urgent care with pain with urination burning feeling with incomplete emptying frequency and getting up multiple times at night.  She was given Keflex which finished a few days ago.  She still has urgency and frequency and a feeling of incomplete emptying but no dysuria  At baseline she was continent getting up once a night and voiding every 90 minutes to 2 hours.  She has had low back surgery no hysterectomy.  She has had a kidney stone with lithotripsy in the past  No history of bladder infections or previous GU surgery otherwise     PMH: Past Medical History:  Diagnosis Date   Anemia    iron deficiency   Arthritis    Basal cell carcinoma 11/19/2016   right ant shoulder/superficial   Basal cell carcinoma 12/24/2016   left upper back/excision   Chicken pox    Depression 2010   Postpartum, pt no longer feels depression   Ectopic pregnancy 2008   Family history of adverse reaction to anesthesia    Mom has hx of PONV   GERD (gastroesophageal reflux disease)    History of kidney stones    Hypertension    PONV (postoperative nausea and vomiting)     Surgical History: Past Surgical History:  Procedure Laterality Date   BTL reversal  2006   CESAREAN SECTION  2007   CESAREAN SECTION  2010   COLONOSCOPY WITH PROPOFOL  N/A 03/16/2020   Procedure: COLONOSCOPY WITH PROPOFOL ;  Surgeon: Laura Keene NOVAK, MD;  Location: [facility] ENDOSCOPY;  Service: Endoscopy;  Laterality: N/A;   LAPAROSCOPY FOR ECTOPIC PREGNANCY  08/2006   left side   LITHOTRIPSY  2004   for kidney stones   LUMBAR LAMINECTOMY/DECOMPRESSION  MICRODISCECTOMY Right 02/06/2022   Procedure: RIGHT-SIDED LUMBAR FIVE THROUGH SACRUM ONE MICRODISECTOMY;  Surgeon: Laura Anes, MD;  Location: [facility] OR;  Service: Orthopedics;  Laterality: Right;   MANDIBLE FRACTURE SURGERY  1987   miscarriage  09/2006   D&C   ROTATOR CUFF REPAIR Right 2020   TONSILLECTOMY  1979   TUBAL LIGATION  1995   BTL   TYMPANOSTOMY TUBE PLACEMENT     2 sets of ear tubes   UMBILICAL HERNIA REPAIR  2011    Home Medications:  Allergies as of 11/23/2023       Reactions   Oxycodone-acetaminophen     REACTION: Hallucinations   Oxycodone-acetaminophen  Other (See Comments)   REACTION: Hallucinations        Medication List        Accurate as of November 23, 2023  3:18 PM. If you have any questions, ask your nurse or doctor.          gabapentin  300 MG capsule Commonly known as: Neurontin  Take 1 capsule (300 mg total) by mouth at bedtime. What changed: when to take this   hydrocortisone  2.5 % ointment Apply to aa QD-BID PRN.   multivitamin tablet Take 1 tablet by mouth daily.   tretinoin  0.025 % cream Commonly known as: RETIN-A  apply pea-sized amount to face at bedtime, wash off in morning   vitamin C 1000 MG tablet Take 1,000  mg by mouth daily.        Allergies:  Allergies  Allergen Reactions   Oxycodone-Acetaminophen      REACTION: Hallucinations   Oxycodone-Acetaminophen  Other (See Comments)    REACTION: Hallucinations    Family History: Family History  Problem Relation Age of Onset   Diabetes Father    COPD Father    Hearing loss Father    Arthritis Mother    Depression Mother    Drug abuse Mother    Hearing loss Mother    Hyperlipidemia Mother    Mental illness Mother    Cancer Paternal Uncle        lung CA   Alzheimer's disease Maternal Grandmother    Early death Maternal Grandmother    Hearing loss Maternal Grandmother    Heart disease Maternal Grandmother    Hyperlipidemia Maternal Grandmother    Stroke Maternal  Grandmother    Heart disease Paternal Grandfather 67       MI   Early death Paternal Grandfather    Breast cancer Maternal Aunt 52   Heart disease Maternal Grandfather    Hypertension Maternal Grandfather     Social History:  reports that she has never smoked. She has never used smokeless tobacco. She reports that she does not drink alcohol and does not use drugs.  ROS:                                        Physical Exam: BP 137/76 (BP Location: Left Arm, Patient Position: Sitting, Cuff Size: Normal)   Pulse 78   Ht 5' 4 (1.626 m)   Wt 66.3 kg   LMP 11/23/2010   SpO2 100%   BMI 25.10 kg/m   Constitutional:  Alert and oriented, No acute distress. HEENT: Show Low AT, moist mucus membranes.  Trachea midline, no masses.   Laboratory Data: Lab Results  Component Value Date   WBC 7.9 04/14/2022   HGB 13.5 04/14/2022   HCT 40.1 04/14/2022   MCV 92.2 04/14/2022   PLT 171 04/14/2022    Lab Results  Component Value Date   CREATININE 0.97 04/14/2022    No results found for: PSA  No results found for: TESTOSTERONE  Lab Results  Component Value Date   HGBA1C 5.7 06/27/2019    Urinalysis    Component Value Date/Time   COLORURINE STRAW (A) 02/12/2022 2230   APPEARANCEUR CLEAR (A) 02/12/2022 2230   LABSPEC 1.019 02/12/2022 2230   PHURINE 7.0 02/12/2022 2230   GLUCOSEU NEGATIVE 02/12/2022 2230   HGBUR NEGATIVE 02/12/2022 2230   HGBUR moderate 04/22/2007 1454   BILIRUBINUR NEGATIVE 02/12/2022 2230   BILIRUBINUR negative 10/07/2019 1210   BILIRUBINUR neg 07/19/2018 1614   KETONESUR NEGATIVE 02/12/2022 2230   PROTEINUR NEGATIVE 02/12/2022 2230   UROBILINOGEN 0.2 10/07/2019 1210   UROBILINOGEN negative 04/22/2007 1454   NITRITE NEGATIVE 02/12/2022 2230   LEUKOCYTESUR TRACE (A) 02/12/2022 2230    Pertinent Imaging: Urine reviewed and sent for culture.  Chart reviewed  Assessment & Plan: Patient may have a urinary tract infection or post UTI  hypersensitivity.  Urine sent for culture.  See extender in 3 weeks.  I gave her Gemtesa samples.  Call if culture positive.  Blood culture was not done in urgent care.  I do not think she has a distal stone at this stage.  1. Urinary urgency (Primary)  - Urinalysis, Complete  No follow-ups on file.  Laura DELENA Elizabeth, MD  [facility] [address], [address] [place], [place] [address] [phone]

## 2023-11-30 LAB — CULTURE, URINE COMPREHENSIVE

## 2023-12-14 ENCOUNTER — Ambulatory Visit: Admitting: Physician Assistant

## 2024-02-08 ENCOUNTER — Ambulatory Visit: Admitting: Urology

## 2024-08-29 ENCOUNTER — Ambulatory Visit: Admitting: Dermatology
